# Patient Record
Sex: Male | Born: 1950
Health system: Southern US, Community
[De-identification: ages and names within clinical notes are randomized; demographics above are authoritative.]

## PROBLEM LIST (undated history)

## (undated) DIAGNOSIS — I447 Left bundle-branch block, unspecified: Secondary | ICD-10-CM

## (undated) DIAGNOSIS — C61 Malignant neoplasm of prostate: Secondary | ICD-10-CM

## (undated) DIAGNOSIS — R338 Other retention of urine: Secondary | ICD-10-CM

## (undated) DIAGNOSIS — N401 Enlarged prostate with lower urinary tract symptoms: Secondary | ICD-10-CM

## (undated) DIAGNOSIS — Z789 Other specified health status: Secondary | ICD-10-CM

## (undated) DIAGNOSIS — Z923 Personal history of irradiation: Secondary | ICD-10-CM

## (undated) DIAGNOSIS — E7800 Pure hypercholesterolemia, unspecified: Secondary | ICD-10-CM

## (undated) DIAGNOSIS — I7090 Unspecified atherosclerosis: Secondary | ICD-10-CM

## (undated) DIAGNOSIS — I10 Essential (primary) hypertension: Secondary | ICD-10-CM

## (undated) HISTORY — DX: Unspecified atherosclerosis: I70.90

## (undated) HISTORY — DX: Left bundle-branch block, unspecified: I44.7

## (undated) HISTORY — DX: Essential (primary) hypertension: I10

## (undated) HISTORY — DX: Pure hypercholesterolemia, unspecified: E78.00

## (undated) HISTORY — DX: Personal history of irradiation: Z92.3

---

## 2002-11-19 ENCOUNTER — Emergency Department (HOSPITAL_COMMUNITY): Admission: EM | Admit: 2002-11-19 | Discharge: 2002-11-19 | Payer: Self-pay | Admitting: *Deleted

## 2002-11-27 ENCOUNTER — Emergency Department (HOSPITAL_COMMUNITY): Admission: EM | Admit: 2002-11-27 | Discharge: 2002-11-27 | Payer: Self-pay | Admitting: Emergency Medicine

## 2011-10-10 HISTORY — PX: PROSTATE BIOPSY: SHX241

## 2011-10-18 ENCOUNTER — Other Ambulatory Visit: Payer: Self-pay | Admitting: Urology

## 2011-10-18 DIAGNOSIS — C61 Malignant neoplasm of prostate: Secondary | ICD-10-CM

## 2011-10-31 ENCOUNTER — Encounter (HOSPITAL_COMMUNITY): Payer: Self-pay

## 2011-10-31 ENCOUNTER — Encounter (HOSPITAL_COMMUNITY)
Admission: RE | Admit: 2011-10-31 | Discharge: 2011-10-31 | Disposition: A | Discharge status: Home / Self Care | Payer: Managed Care, Other (non HMO) | Source: Ambulatory Visit | Attending: Urology | Admitting: Urology

## 2011-10-31 DIAGNOSIS — C61 Malignant neoplasm of prostate: Secondary | ICD-10-CM | POA: Insufficient documentation

## 2011-10-31 HISTORY — DX: Malignant neoplasm of prostate: C61

## 2011-10-31 MED ORDER — TECHNETIUM TC 99M MEDRONATE IV KIT
23.9000 | PACK | Freq: Once | INTRAVENOUS | Status: AC | PRN
  Administered 2011-10-31: 23.9 via INTRAVENOUS

## 2011-11-15 ENCOUNTER — Encounter: Payer: Self-pay | Admitting: *Deleted

## 2011-11-15 ENCOUNTER — Encounter: Payer: Self-pay | Admitting: Radiation Oncology

## 2011-11-18 ENCOUNTER — Ambulatory Visit: Payer: Managed Care, Other (non HMO)

## 2011-11-18 ENCOUNTER — Ambulatory Visit
Admission: RE | Admit: 2011-11-18 | Discharge: 2011-11-18 | Disposition: A | Discharge status: Home / Self Care | Payer: Managed Care, Other (non HMO) | Source: Ambulatory Visit | Attending: Radiation Oncology | Admitting: Radiation Oncology

## 2011-11-18 ENCOUNTER — Ambulatory Visit: Payer: Managed Care, Other (non HMO) | Admitting: Radiation Oncology

## 2011-11-18 ENCOUNTER — Encounter: Payer: Self-pay | Admitting: Radiation Oncology

## 2011-11-18 VITALS — BP 156/79 | HR 72 | Temp 97.6°F | Wt 157.8 lb

## 2011-11-18 DIAGNOSIS — R351 Nocturia: Secondary | ICD-10-CM | POA: Insufficient documentation

## 2011-11-18 DIAGNOSIS — Z8042 Family history of malignant neoplasm of prostate: Secondary | ICD-10-CM | POA: Insufficient documentation

## 2011-11-18 DIAGNOSIS — E7800 Pure hypercholesterolemia, unspecified: Secondary | ICD-10-CM | POA: Insufficient documentation

## 2011-11-18 DIAGNOSIS — R300 Dysuria: Secondary | ICD-10-CM | POA: Insufficient documentation

## 2011-11-18 DIAGNOSIS — C61 Malignant neoplasm of prostate: Secondary | ICD-10-CM

## 2011-11-18 DIAGNOSIS — F17200 Nicotine dependence, unspecified, uncomplicated: Secondary | ICD-10-CM | POA: Insufficient documentation

## 2011-11-18 DIAGNOSIS — I10 Essential (primary) hypertension: Secondary | ICD-10-CM | POA: Insufficient documentation

## 2011-11-18 DIAGNOSIS — Z510 Encounter for antineoplastic radiation therapy: Secondary | ICD-10-CM | POA: Insufficient documentation

## 2011-11-18 NOTE — Progress Notes (Signed)
Radiation Oncology         [phone]) [phone] ________________________________  Initial outpatient Consultation  Name: Thomas Grant MRN: [id number]  Date: 11/18/2011  DOB: Jan 30, 1951  CC:  Thomas Matar, MD   REFERRING PHYSICIAN: Marcine Matar, MD  DIAGNOSIS: 61 year old gentleman with stage TI C. adenocarcinoma prostate with Gleason score of 4+3 and a PSA of 8.29  HISTORY OF PRESENT ILLNESS::Thomas Grant is a 61 y.o. male.  He has a family history of prostate cancer in his brother and was noted to have an elevated PSA of 8.29 by his primary care physician.  Accordingly, he was referred for evaluation in urology by Dr. Retta Grant and digital rectal examination was performed at that time revealing no palpable nodules.  The patient proceeded to transrectal ultrasound with 12 biopsies of the prostate on 10/10/2011.  The prostate volume measured 75 cc.  Out of 12 core biopsies, 11 were positive.  The maximum Gleason score was 4+3, and this was seen in  10% left lateral base. Gleason's 3+3 was noted in 30% of the right base, 50% of the right lateral mid, 50% of the right mid, 50% of the right lateral apex, 20% of the right apex, 5% left base, 10% left lateral base, 30% left mid, 20% left lateral apex, and 10% left apex..  The patient reviewed the biopsy results with his urologist and he has kindly been referred today for discussion of potential radiation treatment options.   PREVIOUS RADIATION THERAPY: No  PAST MEDICAL HISTORY:  has a past medical history of Prostate cancer (10/10/11 bx); HTN (hypertension); Hypercholesterolemia; and Atherosclerosis.    PAST SURGICAL HISTORY: Past Surgical History  Procedure Date  . Prostate biopsy 10/10/11    Adenocarcinoma,gleason= 3+3=6,& 4+3=7,volume=75.8cc,Psa=8.29    FAMILY HISTORY: family history is not on file.  SOCIAL HISTORY:  reports that he has been smoking.  He has never used smokeless tobacco. He reports that he drinks about 1.8  ounces of alcohol per week.  ALLERGIES: Review of patient's allergies indicates no known allergies.  MEDICATIONS:  Current Outpatient Prescriptions  Medication Sig Dispense Refill  . Cyanocobalamin (VITAMIN B 12 PO) Take 1,000 mcg by mouth daily.      . fish oil-omega-3 fatty acids 1000 MG capsule Take 1 g by mouth daily.      . Multiple Vitamin (MULTIVITAMIN) tablet Take 1 tablet by mouth daily.      . silodosin (RAPAFLO) 8 MG CAPS capsule Take 8 mg by mouth daily with breakfast.      . Tamsulosin HCl (FLOMAX) 0.4 MG CAPS Take 0.4 mg by mouth Daily.      . vitamin C (ASCORBIC ACID) 500 MG tablet Take 500 mg by mouth daily.      . Zinc 100 MG TABS Take 100 mg by mouth daily.        REVIEW OF SYSTEMS:  A 15 point review of systems is documented in the electronic medical record. This was obtained by the nursing staff. However, I reviewed this with the patient to discuss relevant findings and make appropriate changes.  A comprehensive review of systems was negative. the patient completed a urinary symptom questionnaire.  He also filled out erectile function questionnaire indicating that he is able to achieve an erection capable of completion of sexual activity on most attempts.   PHYSICAL EXAM: NAD, A&O,  Filed Vitals:   11/18/11 1548  BP: 156/79  Pulse: 72  Temp: 97.6 F (36.4 C)  No nodules on Thomas Grant DRE  RADIOGRAPHY: Nm Bone Scan Whole Body  10/31/2011  *RADIOLOGY REPORT*  Clinical Data: New diagnosis of prostate cancer.  PSA is 8.29.  The patient reports no bone pain, trauma, or fractures.  NUCLEAR MEDICINE WHOLE BODY BONE SCINTIGRAPHY  Technique:  Whole body anterior and posterior images were obtained approximately 3 hours after intravenous injection of radiopharmaceutical.  Radiopharmaceutical: 23. CURIE TC-MDP TECHNETIUM TC 47M MEDRONATE IV KIT  Comparison: CT abdomen pelvis 10/31/2011  Findings: The patient's urinary bladder is quite distended, as seen on today's CT  abdomen and pelvis, suggesting there may be chronic bladder outlet obstruction.  There is no uptake of radiotracer to suggest bony metastatic disease.  Symmetric uptake in the region of the first carpometacarpal joints bilaterally is consistent with degenerative change.  IMPRESSION:  1.  No evidence of bony metastatic disease. 2.  Distended urinary bladder.  Original Report Authenticated By: Thomas Grant, M.D.     IMPRESSION: Thomas Grant is a very nice 61 year-old gentleman with stage TI C. adenocarcinoma prostate with Gleason score 4+3 and a PSA of 8.29. He falls into an intermediate risk group in terms of his Gleason score and appears to have a high volume disease in terms of percentage cores positive. The patient is eligible for a variety of potential treatment options including robotic-assisted laparoscopic radical prostatectomy, and radiation therapy with or without seed implant as boost. With radiotherapy, the patient would also potentially benefit from neoadjuvant androgen deprivation.  PLAN:Today I reviewed the findings and workup thus far.  We discussed the natural history of prostate cancer.  We reviewed the the implications of T-stage, Gleason's Score, and PSA on decision-making and outcomes in prostate cancer.  We discussed radiation treatment in the management of prostate cancer with regard to the logistics and delivery of external beam radiation treatment as well as the logistics and delivery of prostate brachytherapy.  We compared and contrasted each of these approaches and also compared these against prostatectomy.  The patient expressed interest in hormonal therapy and external radiation followed by seed boost.  I filled out a patient counseling form for him with relevant treatment diagrams and we retained a copy for our records.   The patient would like to proceed with androgen deprivation and radiotherapy using brachytherapy boost.  I will share my findings with Dr. Retta Grant and move  forward discussion of androgen deprivation now with scheduling placement of three gold fiducial markers 6 weeks later into the prostate to proceed with radiotherapy.     I enjoyed meeting with him today, and will look forward to participating in the care of this very nice gentleman.   I spent 60 minutes minutes face to face with the patient and more than 50% of that time was spent in counseling and/or coordination of care.   ------------------------------------------------  Artist Pais. Kathrynn Running, M.D.

## 2011-11-18 NOTE — Progress Notes (Signed)
Patient here for formal prostate consultation.Patient relatively healthy. No surgical history.Leaning towards seed implant but just gathering information. IPSS 14, occasional weak stream and nocturia.States that he has had an enlarged prostate as teenager. Gleason (1)  4+3=7 PSA 8.29 (10) 3+3=6.  Prostate volume 76 cc's

## 2011-11-20 ENCOUNTER — Telehealth: Payer: Self-pay | Admitting: *Deleted

## 2011-11-20 NOTE — Telephone Encounter (Signed)
CALLED PATIENT TO INFORM OF APPT. CHANGE  FOR HORMONE THERAPY W/DR. DAHLSTEDT TO 11-27-11- ARRIVAL TIME - 1:45 PM, SPOKE WITH PATIENT AND HE IS AWARE OF THE APPT. CHANGE.

## 2011-11-20 NOTE — Telephone Encounter (Signed)
CALLED PATIENT TO INFORM OF APPT. WITH DR. DAHLSTEDT ON 11-25-11 - ARRIVAL TIME 2:45 PM AND HIS GOLD SEED PLACEMENT ON 01-01-12- ARRIVAL TIME - 10:30 AM, SPOKE WITH PATIENT AND HE IS AWARE OF THESE APPTS.

## 2011-11-22 NOTE — Addendum Note (Signed)
Encounter addended by: Delynn Flavin, RN on: 11/22/2011  7:11 PM<BR>     Documentation filed: Charges VN

## 2011-11-28 ENCOUNTER — Telehealth: Payer: Self-pay | Admitting: *Deleted

## 2011-11-28 NOTE — Telephone Encounter (Signed)
Called patient to inform of appt. For sim on October 4 at 10:00 am, spoke with patient and he is aware of this appt.

## 2011-12-12 ENCOUNTER — Ambulatory Visit: Payer: Managed Care, Other (non HMO) | Admitting: Radiation Oncology

## 2011-12-12 ENCOUNTER — Ambulatory Visit: Payer: Managed Care, Other (non HMO)

## 2011-12-17 ENCOUNTER — Encounter: Payer: Self-pay | Admitting: Gastroenterology

## 2012-01-03 ENCOUNTER — Ambulatory Visit: Payer: Managed Care, Other (non HMO) | Admitting: Radiation Oncology

## 2012-01-03 ENCOUNTER — Ambulatory Visit: Payer: Managed Care, Other (non HMO)

## 2012-01-07 ENCOUNTER — Telehealth: Payer: Self-pay | Admitting: *Deleted

## 2012-01-07 NOTE — Telephone Encounter (Signed)
CALLED PATIENT TO ALTER APPT. TO  ARRIVAL TIME OF 7:50 AM ON 01-08-12, LVM FOR A RETURN CALL

## 2012-01-07 NOTE — Telephone Encounter (Signed)
CALLED PATIENT TO ALTER APPT. FOR 01-08-12, - ARRIVAL TIME - 7:50 AM , LVM FOR A RETURN CALL

## 2012-01-08 ENCOUNTER — Ambulatory Visit
Admission: RE | Admit: 2012-01-08 | Discharge: 2012-01-08 | Disposition: A | Discharge status: Home / Self Care | Payer: Managed Care, Other (non HMO) | Source: Ambulatory Visit | Attending: Radiation Oncology | Admitting: Radiation Oncology

## 2012-01-08 ENCOUNTER — Encounter: Payer: Self-pay | Admitting: Radiation Oncology

## 2012-01-08 DIAGNOSIS — C61 Malignant neoplasm of prostate: Secondary | ICD-10-CM

## 2012-01-08 NOTE — Progress Notes (Signed)
Met with patient to discuss RO billing. ° °Dx: 185 Malignant neoplasm of prostate  ° ° °Attending Rad: Dr. Manning ° ° °Rad Tx: [phone]  IMRT °

## 2012-01-08 NOTE — Progress Notes (Signed)
  Radiation Oncology         [phone]) [phone] ________________________________  Name: Thomas Grant MRN: [id number]  Date: 01/08/2012  DOB: 11-22-1950  SIMULATION AND TREATMENT PLANNING NOTE  DIAGNOSIS:  61 year old gentleman with stage T1c adenocarcinoma prostate with Gleason score of 4+3 and a PSA of 8.29  NARRATIVE:  The patient was brought to the CT Simulation planning suite.  Identity was confirmed.  All relevant records and images related to the planned course of therapy were reviewed.  The patient freely provided informed written consent to proceed with treatment after reviewing the details related to the planned course of therapy. The consent form was witnessed and verified by the simulation staff.  Then, the patient was set-up in a stable reproducible  supine position for radiation therapy.  CT images were obtained.  Surface markings were placed.  The CT images were loaded into the planning software.  Then the target and avoidance structures were contoured.  Treatment planning then occurred.  The radiation prescription was entered and confirmed.  A total of one complex treatment device was fabricated. I have requested : Intensity Modulated Radiotherapy (IMRT) is medically necessary for this case for the following reason:  Rectal sparing.Marland Kitchen    PLAN:  The patient will receive 78 Gy in 40 fractions.  ________________________________  Artist Pais Kathrynn Running, M.D.

## 2012-01-13 ENCOUNTER — Encounter: Payer: Self-pay | Admitting: *Deleted

## 2012-01-14 ENCOUNTER — Encounter: Payer: Self-pay | Admitting: Radiation Oncology

## 2012-01-14 NOTE — Progress Notes (Signed)
  Radiation Oncology         [phone]) [phone] ________________________________  Name: Thomas Grant MRN: [id number]  Date: 01/14/2012  DOB: December 23, 1950  SIMULATION AND TREATMENT PLANNING NOTE PUBIC ARCH STUDY  DIAGNOSIS: 61 year old gentleman with stage T1c adenocarcinoma prostate with Gleason score of 4+3 and a PSA of 8.29  COMPLEX SIMULATION:  The patient presented today for evaluation for possible prostate seed implant. He was brought to the radiation planning suite and placed supine on the CT couch. A 3-dimensional image study set was obtained in upload to the planning computer. There, on each axial slice, I contoured the prostate gland. Then, using three-dimensional radiation planning tools I reconstructed the prostate in view of the structures from the transperineal needle pathway to assess for possible pubic arch interference. In doing so, I did not appreciate any pubic arch interference. Also, the patient's prostate volume was estimated based on the drawn structure. The volume was 67 cc.  He does have a large prostate, but, is actively receiving hormone therapy which should help reduce the volume for seed implant.  He also has a fair amount of pubic arch interference, but, with hyperflexion of the hips, he would still be a candidate to proceed with prostate seed implant. Today, he freely provided informed written consent to proceed.    PLAN: The patient will undergo prostate seed implant as a boost following 45 Gy external beam.   ________________________________  Artist Pais. Kathrynn Running, M.D.

## 2012-01-20 ENCOUNTER — Ambulatory Visit: Payer: Managed Care, Other (non HMO)

## 2012-01-20 ENCOUNTER — Encounter: Payer: Self-pay | Admitting: Radiation Oncology

## 2012-01-20 ENCOUNTER — Ambulatory Visit
Admission: RE | Admit: 2012-01-20 | Discharge: 2012-01-20 | Disposition: A | Discharge status: Home / Self Care | Payer: Managed Care, Other (non HMO) | Source: Ambulatory Visit | Attending: Radiation Oncology | Admitting: Radiation Oncology

## 2012-01-20 NOTE — Progress Notes (Signed)
  Radiation Oncology         [phone]) [phone] ________________________________  Name: Thomas Grant MRN: [id number]  Date: 01/20/2012  DOB: 11-19-1950  Simulation Verification Note  Status: outpatient  NARRATIVE: The patient was brought to the treatment unit and placed in the planned treatment position. The clinical setup was verified. Then port films were obtained and uploaded to the radiation oncology medical record software.  The treatment beams were carefully compared against the planned radiation fields. The position location and shape of the radiation fields was reviewed. They targeted volume of tissue appears to be appropriately covered by the radiation beams. Organs at risk appear to be excluded as planned.  Based on my personal review, I approved the simulation verification. The patient's treatment will proceed as planned.  ------------------------------------------------  Artist Pais Kathrynn Running, M.D.

## 2012-01-21 ENCOUNTER — Ambulatory Visit
Admission: RE | Admit: 2012-01-21 | Discharge: 2012-01-21 | Disposition: A | Discharge status: Home / Self Care | Payer: Managed Care, Other (non HMO) | Source: Ambulatory Visit | Attending: Radiation Oncology | Admitting: Radiation Oncology

## 2012-01-21 ENCOUNTER — Ambulatory Visit: Payer: Managed Care, Other (non HMO)

## 2012-01-22 ENCOUNTER — Ambulatory Visit
Admission: RE | Admit: 2012-01-22 | Discharge: 2012-01-22 | Disposition: A | Discharge status: Home / Self Care | Payer: Managed Care, Other (non HMO) | Source: Ambulatory Visit | Attending: Radiation Oncology | Admitting: Radiation Oncology

## 2012-01-22 ENCOUNTER — Ambulatory Visit: Payer: Managed Care, Other (non HMO)

## 2012-01-22 DIAGNOSIS — C61 Malignant neoplasm of prostate: Secondary | ICD-10-CM

## 2012-01-22 NOTE — Progress Notes (Signed)
Post sim completed w/pt. Gave pt "Radiation and You" booklet w/all pertinent information marked and discussed, re: fatigue, urinary frequency, urinary burning, urinary urgency, diarrhea, rectal discomfort, pain, nutrition. Gave pt RN's business card. Pt verbalized understanding, had no questions.

## 2012-01-23 ENCOUNTER — Encounter: Payer: Self-pay | Admitting: Radiation Oncology

## 2012-01-23 ENCOUNTER — Ambulatory Visit
Admission: RE | Admit: 2012-01-23 | Discharge: 2012-01-23 | Disposition: A | Discharge status: Home / Self Care | Payer: Managed Care, Other (non HMO) | Source: Ambulatory Visit | Attending: Radiation Oncology | Admitting: Radiation Oncology

## 2012-01-23 ENCOUNTER — Ambulatory Visit: Payer: Managed Care, Other (non HMO)

## 2012-01-23 VITALS — BP 128/69 | HR 70 | Temp 97.8°F | Resp 20 | Ht 73.0 in | Wt 157.5 lb

## 2012-01-23 DIAGNOSIS — C61 Malignant neoplasm of prostate: Secondary | ICD-10-CM

## 2012-01-23 NOTE — Progress Notes (Signed)
  Radiation Oncology         [phone] ________________________________  Name: Hendrick Pavich MRN: [id number]  Date: 01/23/2012  DOB: 1950/06/04  Weekly Radiation Therapy Management  Current Dose: 5.4 Gy     Planned Dose:  45 Gy plus seed implant  Narrative . . . . . . . . The patient presents for routine under treatment assessment.                                  The patient is without complaint.                                 Set-up films were reviewed.                                 The chart was checked. Physical Findings. . .  weight is 157 lb 8 oz (71.442 kg). His oral temperature is 97.8 F (36.6 C). His blood pressure is 128/69 and his pulse is 70. His respiration is 20. . Weight essentially stable.  No significant changes. Impression . . . . . . . The patient is  tolerating radiation. Plan . . . . . . . . . . . . Continue treatment as planned.  ________________________________  Artist Pais. Kathrynn Running, M.D.

## 2012-01-23 NOTE — Addendum Note (Signed)
Encounter addended by: Glennie Hawk, RN on: 01/23/2012  9:07 AM<BR>     Documentation filed: Vitals Section

## 2012-01-23 NOTE — Patient Instructions (Signed)
Call if you have any questions.

## 2012-01-23 NOTE — Progress Notes (Signed)
Pt denies pain, fatigue, loss of appetite; no c/o.

## 2012-01-24 ENCOUNTER — Ambulatory Visit
Admission: RE | Admit: 2012-01-24 | Discharge: 2012-01-24 | Disposition: A | Discharge status: Home / Self Care | Payer: Managed Care, Other (non HMO) | Source: Ambulatory Visit | Attending: Radiation Oncology | Admitting: Radiation Oncology

## 2012-01-24 ENCOUNTER — Ambulatory Visit: Payer: Managed Care, Other (non HMO)

## 2012-01-27 ENCOUNTER — Ambulatory Visit: Payer: Managed Care, Other (non HMO)

## 2012-01-27 ENCOUNTER — Ambulatory Visit
Admission: RE | Admit: 2012-01-27 | Discharge: 2012-01-27 | Disposition: A | Discharge status: Home / Self Care | Payer: Managed Care, Other (non HMO) | Source: Ambulatory Visit | Attending: Radiation Oncology | Admitting: Radiation Oncology

## 2012-01-28 ENCOUNTER — Ambulatory Visit: Payer: Managed Care, Other (non HMO)

## 2012-01-28 ENCOUNTER — Ambulatory Visit
Admission: RE | Admit: 2012-01-28 | Discharge: 2012-01-28 | Disposition: A | Discharge status: Home / Self Care | Payer: Managed Care, Other (non HMO) | Source: Ambulatory Visit | Attending: Radiation Oncology | Admitting: Radiation Oncology

## 2012-01-29 ENCOUNTER — Ambulatory Visit: Payer: Managed Care, Other (non HMO)

## 2012-01-29 ENCOUNTER — Ambulatory Visit
Admission: RE | Admit: 2012-01-29 | Discharge: 2012-01-29 | Disposition: A | Discharge status: Home / Self Care | Payer: Managed Care, Other (non HMO) | Source: Ambulatory Visit | Attending: Radiation Oncology | Admitting: Radiation Oncology

## 2012-01-30 ENCOUNTER — Ambulatory Visit: Payer: Managed Care, Other (non HMO)

## 2012-01-30 ENCOUNTER — Ambulatory Visit
Admission: RE | Admit: 2012-01-30 | Discharge: 2012-01-30 | Disposition: A | Discharge status: Home / Self Care | Payer: Managed Care, Other (non HMO) | Source: Ambulatory Visit | Attending: Radiation Oncology | Admitting: Radiation Oncology

## 2012-01-31 ENCOUNTER — Ambulatory Visit
Admission: RE | Admit: 2012-01-31 | Discharge: 2012-01-31 | Disposition: A | Discharge status: Home / Self Care | Payer: Managed Care, Other (non HMO) | Source: Ambulatory Visit | Attending: Radiation Oncology | Admitting: Radiation Oncology

## 2012-01-31 ENCOUNTER — Encounter: Payer: Self-pay | Admitting: Radiation Oncology

## 2012-01-31 ENCOUNTER — Ambulatory Visit: Payer: Managed Care, Other (non HMO)

## 2012-01-31 ENCOUNTER — Telehealth: Payer: Self-pay | Admitting: *Deleted

## 2012-01-31 VITALS — BP 131/81 | HR 69 | Temp 98.3°F | Resp 20 | Ht 71.0 in | Wt 157.9 lb

## 2012-01-31 DIAGNOSIS — R300 Dysuria: Secondary | ICD-10-CM

## 2012-01-31 LAB — URINALYSIS, MICROSCOPIC - CHCC
Bilirubin (Urine): NEGATIVE
Specific Gravity, Urine: 1.01 (ref 1.003–1.035)
pH: 6 (ref 4.6–8.0)

## 2012-01-31 NOTE — Progress Notes (Signed)
  Radiation Oncology         [phone] ________________________________  Name: Thomas Grant MRN: [id number]  Date: 01/31/2012  DOB: 1950-07-28  Weekly Radiation Therapy Management  Current Dose: 16.2 Gy     Planned Dose:  45 Gy  Narrative . . . . . . . . The patient presents for routine under treatment assessment.                                                     The patient is without complaint.                                 Set-up films were reviewed.                                 The chart was checked. Physical Findings. . .  height is 5\' 11"  (1.803 m) and weight is 157 lb 14.4 oz (71.623 kg). His oral temperature is 98.3 F (36.8 C). His blood pressure is 131/81 and his pulse is 69. His respiration is 20. . Weight essentially stable.  No significant changes. Impression . . . . . . . The patient is  tolerating radiation. Plan . . . . . . . . . . . . Continue treatment as planned.  ________________________________  Artist Pais. Kathrynn Running, M.D.

## 2012-01-31 NOTE — Telephone Encounter (Signed)
Per Dr Kathrynn Running, left vm informing pt his 01/31/12 UA results are normal. Left call back # if pt had any questions.

## 2012-01-31 NOTE — Progress Notes (Signed)
Pt states this week he has developed weak urinary stream w/difficulty initiating stream and emptying bladder, dysuria, freq q 1 hr, nocturia x 4-5. He states his bm's are softer w/increase in flatus. Pt denies other pain, fatigue, loss of appetite.

## 2012-01-31 NOTE — Progress Notes (Signed)
Quick Note:  Please call patient with normal result.  Thanks. MM ______ 

## 2012-02-02 NOTE — Progress Notes (Signed)
Quick Note:  Please call patient with normal result.  Thanks. MM ______ 

## 2012-02-03 ENCOUNTER — Ambulatory Visit
Admission: RE | Admit: 2012-02-03 | Discharge: 2012-02-03 | Disposition: A | Discharge status: Home / Self Care | Payer: Managed Care, Other (non HMO) | Source: Ambulatory Visit | Attending: Radiation Oncology | Admitting: Radiation Oncology

## 2012-02-03 ENCOUNTER — Ambulatory Visit: Payer: Managed Care, Other (non HMO)

## 2012-02-03 NOTE — Progress Notes (Signed)
Per Dr Kathrynn Running, informed pt his UA, culture results from 01/31/12 were normal.

## 2012-02-04 ENCOUNTER — Ambulatory Visit
Admission: RE | Admit: 2012-02-04 | Discharge: 2012-02-04 | Disposition: A | Discharge status: Home / Self Care | Payer: Managed Care, Other (non HMO) | Source: Ambulatory Visit | Attending: Radiation Oncology | Admitting: Radiation Oncology

## 2012-02-04 ENCOUNTER — Ambulatory Visit: Payer: Managed Care, Other (non HMO)

## 2012-02-05 ENCOUNTER — Ambulatory Visit: Payer: Managed Care, Other (non HMO)

## 2012-02-05 ENCOUNTER — Ambulatory Visit
Admission: RE | Admit: 2012-02-05 | Discharge: 2012-02-05 | Disposition: A | Discharge status: Home / Self Care | Payer: Managed Care, Other (non HMO) | Source: Ambulatory Visit | Attending: Radiation Oncology | Admitting: Radiation Oncology

## 2012-02-06 ENCOUNTER — Ambulatory Visit
Admission: RE | Admit: 2012-02-06 | Discharge: 2012-02-06 | Disposition: A | Discharge status: Home / Self Care | Payer: Managed Care, Other (non HMO) | Source: Ambulatory Visit | Attending: Radiation Oncology | Admitting: Radiation Oncology

## 2012-02-06 ENCOUNTER — Encounter: Payer: Self-pay | Admitting: Radiation Oncology

## 2012-02-06 ENCOUNTER — Ambulatory Visit: Payer: Managed Care, Other (non HMO)

## 2012-02-06 VITALS — BP 118/79 | HR 71 | Temp 98.4°F | Resp 20 | Wt 155.0 lb

## 2012-02-06 DIAGNOSIS — C61 Malignant neoplasm of prostate: Secondary | ICD-10-CM

## 2012-02-06 NOTE — Progress Notes (Signed)
  Radiation Oncology         [phone] ________________________________  Name: Thomas Grant MRN: [id number]  Date: 02/06/2012  DOB: 1950-11-23  Weekly Radiation Therapy Management  Current Dose: 23.4 Gy     Planned Dose:  45 Gy plus seeds.  Narrative . . . . . . . . The patient presents for routine under treatment assessment.                                                  Pt states he continues to have dysuria, nocturia x 2-3, increased flatus. Pt doubled up his Rapaflo, states he has noticed improvement. Pt continues to have urinary pressure and difficulty initiating stream.                                 Set-up films were reviewed.                                 The chart was checked. Physical Findings. . .  weight is 155 lb (70.308 kg). His oral temperature is 98.4 F (36.9 C). His blood pressure is 118/79 and his pulse is 71. His respiration is 20. . Weight essentially stable.  No significant changes. Impression . . . . . . . The patient is  tolerating radiation. Plan . . . . . . . . . . . . Continue treatment as planned.  ________________________________  Artist Pais. Kathrynn Running, M.D.

## 2012-02-06 NOTE — Progress Notes (Signed)
Pt states he continues to have dysuria, nocturia x 2-3, increased flatus. Pt doubled up his Rapaflo, states he has noticed improvement. Pt continues to have urinary pressure and difficulty initiating stream.

## 2012-02-07 ENCOUNTER — Ambulatory Visit
Admission: RE | Admit: 2012-02-07 | Discharge: 2012-02-07 | Disposition: A | Discharge status: Home / Self Care | Payer: Managed Care, Other (non HMO) | Source: Ambulatory Visit | Attending: Radiation Oncology | Admitting: Radiation Oncology

## 2012-02-07 ENCOUNTER — Ambulatory Visit: Payer: Managed Care, Other (non HMO)

## 2012-02-10 ENCOUNTER — Ambulatory Visit: Payer: Managed Care, Other (non HMO)

## 2012-02-10 ENCOUNTER — Ambulatory Visit
Admission: RE | Admit: 2012-02-10 | Discharge: 2012-02-10 | Disposition: A | Discharge status: Home / Self Care | Payer: Managed Care, Other (non HMO) | Source: Ambulatory Visit | Attending: Radiation Oncology | Admitting: Radiation Oncology

## 2012-02-11 ENCOUNTER — Other Ambulatory Visit: Payer: Self-pay | Admitting: Urology

## 2012-02-11 ENCOUNTER — Ambulatory Visit
Admission: RE | Admit: 2012-02-11 | Discharge: 2012-02-11 | Disposition: A | Discharge status: Home / Self Care | Payer: Managed Care, Other (non HMO) | Source: Ambulatory Visit | Attending: Radiation Oncology | Admitting: Radiation Oncology

## 2012-02-11 ENCOUNTER — Ambulatory Visit: Payer: Managed Care, Other (non HMO)

## 2012-02-12 ENCOUNTER — Ambulatory Visit: Payer: Managed Care, Other (non HMO)

## 2012-02-12 ENCOUNTER — Ambulatory Visit
Admission: RE | Admit: 2012-02-12 | Discharge: 2012-02-12 | Disposition: A | Discharge status: Home / Self Care | Payer: Managed Care, Other (non HMO) | Source: Ambulatory Visit | Attending: Radiation Oncology | Admitting: Radiation Oncology

## 2012-02-13 ENCOUNTER — Encounter: Payer: Self-pay | Admitting: Radiation Oncology

## 2012-02-13 ENCOUNTER — Ambulatory Visit: Payer: Managed Care, Other (non HMO)

## 2012-02-13 ENCOUNTER — Ambulatory Visit
Admission: RE | Admit: 2012-02-13 | Discharge: 2012-02-13 | Disposition: A | Discharge status: Home / Self Care | Payer: Managed Care, Other (non HMO) | Source: Ambulatory Visit | Attending: Radiation Oncology | Admitting: Radiation Oncology

## 2012-02-13 VITALS — BP 144/75 | HR 75 | Temp 98.0°F | Resp 20 | Wt 158.3 lb

## 2012-02-13 DIAGNOSIS — C61 Malignant neoplasm of prostate: Secondary | ICD-10-CM

## 2012-02-13 NOTE — Progress Notes (Signed)
  Radiation Oncology         [phone] ________________________________  Name: Thomas Grant MRN: [id number]  Date: 02/13/2012  DOB: 05-22-1950  Weekly Radiation Therapy Management  Current Dose: 32.4 Gy     Planned Dose:  45 Gy plus seeds  Narrative . . . . . . . . The patient presents for routine under treatment assessment.                                           Patient here for weekly rad txs, 18/26 completed, still has dysuria, hard to start his stream, weak, and doesn't empty completely, urgency and frequency as well when voiding, nocturia 3-4x night, eating and drinking well, no other issues    Set-up films were reviewed.                                 The chart was checked. Physical Findings. . .  weight is 158 lb 4.8 oz (71.804 kg). His oral temperature is 98 F (36.7 C). His blood pressure is 144/75 and his pulse is 75. His respiration is 20. . Weight essentially stable.  No significant changes. Impression . . . . . . . The patient is  tolerating radiation. Plan . . . . . . . . . . . . Continue treatment as planned.  ________________________________  Artist Pais. Kathrynn Running, M.D.

## 2012-02-13 NOTE — Progress Notes (Signed)
Patient here for weekly rad txs, 18/26 completed, still has dysuria, hard to start his stream, weak, and doesn't empty completely, urgency and frequency as well when voiding, nocturia 3-4x night, eating and drinking well, no other issues 8:31 AM

## 2012-02-14 ENCOUNTER — Ambulatory Visit
Admission: RE | Admit: 2012-02-14 | Discharge: 2012-02-14 | Disposition: A | Discharge status: Home / Self Care | Payer: Managed Care, Other (non HMO) | Source: Ambulatory Visit | Attending: Radiation Oncology | Admitting: Radiation Oncology

## 2012-02-14 ENCOUNTER — Ambulatory Visit: Payer: Managed Care, Other (non HMO)

## 2012-02-17 ENCOUNTER — Ambulatory Visit: Payer: Managed Care, Other (non HMO)

## 2012-02-17 ENCOUNTER — Ambulatory Visit
Admission: RE | Admit: 2012-02-17 | Discharge: 2012-02-17 | Disposition: A | Discharge status: Home / Self Care | Payer: Managed Care, Other (non HMO) | Source: Ambulatory Visit | Attending: Radiation Oncology | Admitting: Radiation Oncology

## 2012-02-17 DIAGNOSIS — Z8042 Family history of malignant neoplasm of prostate: Secondary | ICD-10-CM | POA: Insufficient documentation

## 2012-02-17 DIAGNOSIS — R351 Nocturia: Secondary | ICD-10-CM | POA: Insufficient documentation

## 2012-02-17 DIAGNOSIS — I10 Essential (primary) hypertension: Secondary | ICD-10-CM | POA: Insufficient documentation

## 2012-02-17 DIAGNOSIS — F17200 Nicotine dependence, unspecified, uncomplicated: Secondary | ICD-10-CM | POA: Insufficient documentation

## 2012-02-17 DIAGNOSIS — Z510 Encounter for antineoplastic radiation therapy: Secondary | ICD-10-CM | POA: Insufficient documentation

## 2012-02-17 DIAGNOSIS — C61 Malignant neoplasm of prostate: Secondary | ICD-10-CM | POA: Insufficient documentation

## 2012-02-17 DIAGNOSIS — R300 Dysuria: Secondary | ICD-10-CM | POA: Insufficient documentation

## 2012-02-17 DIAGNOSIS — E7800 Pure hypercholesterolemia, unspecified: Secondary | ICD-10-CM | POA: Insufficient documentation

## 2012-02-18 ENCOUNTER — Ambulatory Visit: Payer: Managed Care, Other (non HMO)

## 2012-02-18 ENCOUNTER — Ambulatory Visit
Admission: RE | Admit: 2012-02-18 | Discharge: 2012-02-18 | Disposition: A | Discharge status: Home / Self Care | Payer: Managed Care, Other (non HMO) | Source: Ambulatory Visit | Attending: Radiation Oncology | Admitting: Radiation Oncology

## 2012-02-19 ENCOUNTER — Ambulatory Visit (HOSPITAL_BASED_OUTPATIENT_CLINIC_OR_DEPARTMENT_OTHER)
Admission: RE | Admit: 2012-02-19 | Discharge: 2012-02-19 | Disposition: A | Discharge status: Home / Self Care | Payer: Managed Care, Other (non HMO) | Source: Ambulatory Visit | Attending: Urology | Admitting: Urology

## 2012-02-19 ENCOUNTER — Encounter (HOSPITAL_BASED_OUTPATIENT_CLINIC_OR_DEPARTMENT_OTHER)
Admission: RE | Admit: 2012-02-19 | Discharge: 2012-02-19 | Disposition: A | Discharge status: Home / Self Care | Payer: Managed Care, Other (non HMO) | Source: Ambulatory Visit | Attending: Urology | Admitting: Urology

## 2012-02-19 ENCOUNTER — Ambulatory Visit
Admission: RE | Admit: 2012-02-19 | Discharge: 2012-02-19 | Disposition: A | Discharge status: Home / Self Care | Payer: Managed Care, Other (non HMO) | Source: Ambulatory Visit | Attending: Radiation Oncology | Admitting: Radiation Oncology

## 2012-02-19 ENCOUNTER — Ambulatory Visit: Payer: Managed Care, Other (non HMO)

## 2012-02-19 ENCOUNTER — Other Ambulatory Visit: Payer: Self-pay

## 2012-02-19 DIAGNOSIS — C61 Malignant neoplasm of prostate: Secondary | ICD-10-CM | POA: Insufficient documentation

## 2012-02-20 ENCOUNTER — Ambulatory Visit: Payer: Managed Care, Other (non HMO)

## 2012-02-20 ENCOUNTER — Encounter: Payer: Self-pay | Admitting: Radiation Oncology

## 2012-02-20 ENCOUNTER — Ambulatory Visit
Admission: RE | Admit: 2012-02-20 | Discharge: 2012-02-20 | Disposition: A | Discharge status: Home / Self Care | Payer: Managed Care, Other (non HMO) | Source: Ambulatory Visit | Attending: Radiation Oncology | Admitting: Radiation Oncology

## 2012-02-20 VITALS — BP 114/84 | HR 76 | Temp 98.0°F | Resp 20 | Wt 156.1 lb

## 2012-02-20 DIAGNOSIS — C61 Malignant neoplasm of prostate: Secondary | ICD-10-CM

## 2012-02-20 NOTE — Progress Notes (Signed)
Pt states he began Flomax a few days ago and his urinary, bowel status "is almost normal". He states he has nocturia x 1-2, no dysuria, urgency, or other issues he was experiencing. Pt states less fatigue as well.

## 2012-02-20 NOTE — Progress Notes (Signed)
  Radiation Oncology         [phone] ________________________________  Name: Thomas Grant MRN: [id number]  Date: 02/20/2012  DOB: Jul 28, 1950  Weekly Radiation Therapy Management  Current Dose: 41.4 Gy     Planned Dose:  45 Gy plus seeds  Narrative . . . . . . . . The patient presents for routine under treatment assessment.                                           The patient is without complaint.                                 Set-up films were reviewed.                                 The chart was checked. Physical Findings. . .  weight is 156 lb 1.6 oz (70.806 kg). His oral temperature is 98 F (36.7 C). His blood pressure is 114/84 and his pulse is 76. His respiration is 20. . Weight essentially stable.  No significant changes. Impression . . . . . . . The patient is  tolerating radiation. Plan . . . . . . . . . . . . Continue treatment as planned.  ________________________________  Artist Pais. Kathrynn Running, M.D.

## 2012-02-21 ENCOUNTER — Ambulatory Visit
Admission: RE | Admit: 2012-02-21 | Discharge: 2012-02-21 | Disposition: A | Discharge status: Home / Self Care | Payer: Managed Care, Other (non HMO) | Source: Ambulatory Visit | Attending: Radiation Oncology | Admitting: Radiation Oncology

## 2012-02-24 ENCOUNTER — Ambulatory Visit
Admission: RE | Admit: 2012-02-24 | Discharge: 2012-02-24 | Disposition: A | Discharge status: Home / Self Care | Payer: Managed Care, Other (non HMO) | Source: Ambulatory Visit | Attending: Radiation Oncology | Admitting: Radiation Oncology

## 2012-02-24 ENCOUNTER — Encounter: Payer: Self-pay | Admitting: Radiation Oncology

## 2012-02-25 ENCOUNTER — Ambulatory Visit: Payer: Managed Care, Other (non HMO)

## 2012-02-26 ENCOUNTER — Ambulatory Visit: Payer: Managed Care, Other (non HMO)

## 2012-02-28 ENCOUNTER — Ambulatory Visit: Payer: Managed Care, Other (non HMO)

## 2012-02-29 NOTE — Progress Notes (Signed)
  Radiation Oncology         [phone]) [phone] ________________________________  Name: Thomas Grant MRN: [id number]  Date: 02/24/2012  DOB: 1950-10-31  End of Treatment Note  Diagnosis:   61 year old gentleman with stage TI C. adenocarcinoma prostate with Gleason score of 4+3 and a PSA of 8.29  Indication for treatment:  Curative external radiation with seed implant boost       Radiation treatment dates:  01/21/2012-02/24/2012  Site/dose:   45 Gy was delivered to the prostate in 25 fractions of 1.8 Gy  Beams/energy:   A standard 4-field 3D conformal beam arrangement was used with daily image guidance to deliver 15 MV photons.  Narrative: The patient tolerated radiation treatment relatively well.   He had urinary symptoms and outflow obstructive symptoms.  Plan: The patient has completed radiation treatment. The patient will proceed to seed implant boost in 3 weeks.  ________________________________  Artist Pais Kathrynn Running, M.D.

## 2012-03-02 ENCOUNTER — Ambulatory Visit: Payer: Managed Care, Other (non HMO)

## 2012-03-03 ENCOUNTER — Ambulatory Visit: Payer: Managed Care, Other (non HMO)

## 2012-03-04 ENCOUNTER — Ambulatory Visit: Payer: Managed Care, Other (non HMO)

## 2012-03-05 ENCOUNTER — Ambulatory Visit: Payer: Managed Care, Other (non HMO)

## 2012-03-06 ENCOUNTER — Ambulatory Visit: Payer: Managed Care, Other (non HMO)

## 2012-03-09 ENCOUNTER — Ambulatory Visit: Payer: Managed Care, Other (non HMO)

## 2012-03-10 ENCOUNTER — Ambulatory Visit: Payer: Managed Care, Other (non HMO)

## 2012-03-11 ENCOUNTER — Ambulatory Visit: Payer: Managed Care, Other (non HMO)

## 2012-03-11 ENCOUNTER — Telehealth: Payer: Self-pay | Admitting: *Deleted

## 2012-03-11 NOTE — Telephone Encounter (Signed)
Called patient to remind of appt., lvm for a return call 

## 2012-03-12 ENCOUNTER — Ambulatory Visit: Payer: Managed Care, Other (non HMO)

## 2012-03-12 LAB — CBC
Hemoglobin: 13.9 g/dL (ref 13.0–17.0)
MCV: 91.8 fL (ref 78.0–100.0)
Platelets: 259 10*3/uL (ref 150–400)
RBC: 4.38 MIL/uL (ref 4.22–5.81)
WBC: 5.5 10*3/uL (ref 4.0–10.5)

## 2012-03-12 LAB — COMPREHENSIVE METABOLIC PANEL
ALT: 31 U/L (ref 0–53)
AST: 25 U/L (ref 0–37)
CO2: 28 mEq/L (ref 19–32)
Chloride: 103 mEq/L (ref 96–112)
GFR calc Af Amer: >90 mL/min (ref >90)
GFR calc non Af Amer: >90 mL/min (ref >90)
Glucose, Bld: 94 mg/dL (ref 70–99)
Sodium: 139 mEq/L (ref 135–145)
Total Bilirubin: 0.3 mg/dL (ref 0.3–1.2)

## 2012-03-13 ENCOUNTER — Inpatient Hospital Stay: Admission: RE | Admit: 2012-03-13 | Payer: Self-pay | Source: Ambulatory Visit

## 2012-03-13 ENCOUNTER — Ambulatory Visit: Payer: Managed Care, Other (non HMO)

## 2012-03-16 ENCOUNTER — Ambulatory Visit: Payer: Managed Care, Other (non HMO)

## 2012-03-17 ENCOUNTER — Encounter (HOSPITAL_BASED_OUTPATIENT_CLINIC_OR_DEPARTMENT_OTHER): Payer: Self-pay | Admitting: *Deleted

## 2012-03-17 NOTE — Progress Notes (Signed)
Pt instructed npo p mn 12/18.  To [facility] 12/19 @ 0615.  Pt to do fleets enema prior to arrival. Labs, cxr, ekg in epic.

## 2012-03-18 ENCOUNTER — Telehealth: Payer: Self-pay | Admitting: *Deleted

## 2012-03-18 NOTE — Telephone Encounter (Signed)
Called patient to remind of procedure for 03-19-12, lvm for a return call

## 2012-03-18 NOTE — Anesthesia Preprocedure Evaluation (Addendum)
Anesthesia Evaluation  Patient identified by MRN, date of birth, ID band Patient awake    Reviewed: Allergy & Precautions, H&P , NPO status , Patient's Chart, lab work & pertinent test results  Airway Mallampati: II TM Distance: <3 FB Neck ROM: Full    Dental No notable dental hx.    Pulmonary Current Smoker,  breath sounds clear to auscultation  Pulmonary exam normal       Cardiovascular Rhythm:Regular Rate:Normal     Neuro/Psych negative neurological ROS  negative psych ROS   GI/Hepatic negative GI ROS, Neg liver ROS,   Endo/Other  negative endocrine ROS  Renal/GU negative Renal ROS  negative genitourinary   Musculoskeletal negative musculoskeletal ROS (+)   Abdominal   Peds negative pediatric ROS (+)  Hematology negative hematology ROS (+)   Anesthesia Other Findings   Reproductive/Obstetrics negative OB ROS                          Anesthesia Physical Anesthesia Plan  ASA: II  Anesthesia Plan: General   Post-op Pain Management:    Induction: Intravenous  Airway Management Planned: LMA  Additional Equipment:   Intra-op Plan:   Post-operative Plan:   Informed Consent: I have reviewed the patients History and Physical, chart, labs and discussed the procedure including the risks, benefits and alternatives for the proposed anesthesia with the patient or authorized representative who has indicated his/her understanding and acceptance.   Dental advisory given  Plan Discussed with: CRNA and Surgeon  Anesthesia Plan Comments:        Anesthesia Quick Evaluation

## 2012-03-19 ENCOUNTER — Ambulatory Visit (HOSPITAL_COMMUNITY): Payer: Managed Care, Other (non HMO)

## 2012-03-19 ENCOUNTER — Ambulatory Visit (HOSPITAL_BASED_OUTPATIENT_CLINIC_OR_DEPARTMENT_OTHER): Payer: Managed Care, Other (non HMO) | Admitting: Anesthesiology

## 2012-03-19 ENCOUNTER — Encounter (HOSPITAL_BASED_OUTPATIENT_CLINIC_OR_DEPARTMENT_OTHER): Payer: Self-pay | Admitting: *Deleted

## 2012-03-19 ENCOUNTER — Encounter (HOSPITAL_BASED_OUTPATIENT_CLINIC_OR_DEPARTMENT_OTHER)
Admission: RE | Disposition: A | Discharge status: Home / Self Care | Payer: Self-pay | Source: Ambulatory Visit | Attending: Urology

## 2012-03-19 ENCOUNTER — Encounter (HOSPITAL_BASED_OUTPATIENT_CLINIC_OR_DEPARTMENT_OTHER): Payer: Self-pay | Admitting: Anesthesiology

## 2012-03-19 ENCOUNTER — Ambulatory Visit (HOSPITAL_BASED_OUTPATIENT_CLINIC_OR_DEPARTMENT_OTHER)
Admission: RE | Admit: 2012-03-19 | Discharge: 2012-03-19 | Disposition: A | Discharge status: Home / Self Care | Payer: Managed Care, Other (non HMO) | Source: Ambulatory Visit | Attending: Urology | Admitting: Urology

## 2012-03-19 DIAGNOSIS — E7800 Pure hypercholesterolemia, unspecified: Secondary | ICD-10-CM | POA: Insufficient documentation

## 2012-03-19 DIAGNOSIS — Z79899 Other long term (current) drug therapy: Secondary | ICD-10-CM | POA: Insufficient documentation

## 2012-03-19 DIAGNOSIS — C61 Malignant neoplasm of prostate: Secondary | ICD-10-CM | POA: Insufficient documentation

## 2012-03-19 DIAGNOSIS — I10 Essential (primary) hypertension: Secondary | ICD-10-CM | POA: Insufficient documentation

## 2012-03-19 HISTORY — PX: RADIOACTIVE SEED IMPLANT: SHX5150

## 2012-03-19 HISTORY — PX: CYSTOSCOPY: SHX5120

## 2012-03-19 SURGERY — INSERTION, RADIATION SOURCE, PROSTATE
Anesthesia: General | Site: Prostate | Wound class: Clean Contaminated

## 2012-03-19 MED ORDER — LACTATED RINGERS IV SOLN
INTRAVENOUS | Status: DC
  Administered 2012-03-19 (×3): via INTRAVENOUS
  Filled 2012-03-19: qty 1000

## 2012-03-19 MED ORDER — OXYCODONE HCL 5 MG PO TABS
5.0000 mg | ORAL_TABLET | ORAL | Status: DC | PRN
  Filled 2012-03-19: qty 2

## 2012-03-19 MED ORDER — SODIUM CHLORIDE 0.9 % IJ SOLN
3.0000 mL | INTRAMUSCULAR | Status: DC | PRN
  Filled 2012-03-19: qty 3

## 2012-03-19 MED ORDER — EPHEDRINE SULFATE 50 MG/ML IJ SOLN
INTRAMUSCULAR | Status: DC | PRN
  Administered 2012-03-19 (×2): 10 mg via INTRAVENOUS

## 2012-03-19 MED ORDER — PROMETHAZINE HCL 25 MG/ML IJ SOLN
6.2500 mg | INTRAMUSCULAR | Status: DC | PRN
  Filled 2012-03-19: qty 1

## 2012-03-19 MED ORDER — KETOROLAC TROMETHAMINE 30 MG/ML IJ SOLN
15.0000 mg | Freq: Once | INTRAMUSCULAR | Status: DC | PRN
  Filled 2012-03-19: qty 1

## 2012-03-19 MED ORDER — IOHEXOL 350 MG/ML SOLN
INTRAVENOUS | Status: DC | PRN
  Administered 2012-03-19: 7 mL

## 2012-03-19 MED ORDER — ACETAMINOPHEN 650 MG RE SUPP
650.0000 mg | RECTAL | Status: DC | PRN
  Filled 2012-03-19: qty 1

## 2012-03-19 MED ORDER — STERILE WATER FOR IRRIGATION IR SOLN
Status: DC | PRN
  Administered 2012-03-19: 3000 mL

## 2012-03-19 MED ORDER — SODIUM CHLORIDE 0.9 % IV SOLN
250.0000 mL | INTRAVENOUS | Status: DC | PRN
  Filled 2012-03-19: qty 250

## 2012-03-19 MED ORDER — CIPROFLOXACIN IN D5W 400 MG/200ML IV SOLN
400.0000 mg | INTRAVENOUS | Status: AC
  Administered 2012-03-19: 400 mg via INTRAVENOUS
  Filled 2012-03-19: qty 200

## 2012-03-19 MED ORDER — FLEET ENEMA 7-19 GM/118ML RE ENEM
1.0000 | ENEMA | Freq: Once | RECTAL | Status: DC
  Filled 2012-03-19: qty 1

## 2012-03-19 MED ORDER — FENTANYL CITRATE 0.05 MG/ML IJ SOLN
INTRAMUSCULAR | Status: DC | PRN
  Administered 2012-03-19 (×3): 25 ug via INTRAVENOUS
  Administered 2012-03-19 (×2): 50 ug via INTRAVENOUS
  Administered 2012-03-19: 25 ug via INTRAVENOUS

## 2012-03-19 MED ORDER — PROPOFOL 10 MG/ML IV BOLUS
INTRAVENOUS | Status: DC | PRN
  Administered 2012-03-19: 200 mg via INTRAVENOUS
  Administered 2012-03-19: 50 mg via INTRAVENOUS

## 2012-03-19 MED ORDER — ONDANSETRON HCL 4 MG/2ML IJ SOLN
4.0000 mg | Freq: Four times a day (QID) | INTRAMUSCULAR | Status: DC | PRN
  Filled 2012-03-19: qty 2

## 2012-03-19 MED ORDER — HYDROCODONE-ACETAMINOPHEN 5-500 MG PO CAPS: 1.0000 | ORAL_CAPSULE | ORAL | Status: DC | PRN

## 2012-03-19 MED ORDER — ACETAMINOPHEN 325 MG PO TABS
650.0000 mg | ORAL_TABLET | ORAL | Status: DC | PRN
  Filled 2012-03-19: qty 2

## 2012-03-19 MED ORDER — ACETAMINOPHEN 10 MG/ML IV SOLN
INTRAVENOUS | Status: DC | PRN
  Administered 2012-03-19: 1000 mg via INTRAVENOUS

## 2012-03-19 MED ORDER — HYDROMORPHONE HCL PF 1 MG/ML IJ SOLN
0.2500 mg | INTRAMUSCULAR | Status: DC | PRN
  Filled 2012-03-19: qty 1

## 2012-03-19 MED ORDER — LIDOCAINE HCL (CARDIAC) 20 MG/ML IV SOLN
INTRAVENOUS | Status: DC | PRN
  Administered 2012-03-19: 75 mg via INTRAVENOUS

## 2012-03-19 MED ORDER — ONDANSETRON HCL 4 MG/2ML IJ SOLN
INTRAMUSCULAR | Status: DC | PRN
  Administered 2012-03-19: 4 mg via INTRAVENOUS

## 2012-03-19 MED ORDER — METOCLOPRAMIDE HCL 5 MG/ML IJ SOLN
INTRAMUSCULAR | Status: DC | PRN
  Administered 2012-03-19: 10 mg via INTRAVENOUS

## 2012-03-19 MED ORDER — DEXAMETHASONE SODIUM PHOSPHATE 4 MG/ML IJ SOLN
INTRAMUSCULAR | Status: DC | PRN
  Administered 2012-03-19: 10 mg via INTRAVENOUS

## 2012-03-19 MED ORDER — SODIUM CHLORIDE 0.9 % IJ SOLN
3.0000 mL | Freq: Two times a day (BID) | INTRAMUSCULAR | Status: DC
  Filled 2012-03-19: qty 3

## 2012-03-19 SURGICAL SUPPLY — 26 items
BAG URINE DRAINAGE (UROLOGICAL SUPPLIES) ×3 IMPLANT
BLADE SURG ROTATE 9660 (MISCELLANEOUS) ×3 IMPLANT
CATH FOLEY 2WAY SLVR  5CC 16FR (CATHETERS) ×1
CATH FOLEY 2WAY SLVR 5CC 16FR (CATHETERS) ×2 IMPLANT
CATH ROBINSON RED A/P 20FR (CATHETERS) ×3 IMPLANT
CLOTH BEACON ORANGE TIMEOUT ST (SAFETY) ×3 IMPLANT
COVER MAYO STAND STRL (DRAPES) ×3 IMPLANT
COVER TABLE BACK 60X90 (DRAPES) ×3 IMPLANT
DRSG TEGADERM 4X4.75 (GAUZE/BANDAGES/DRESSINGS) ×3 IMPLANT
DRSG TEGADERM 8X12 (GAUZE/BANDAGES/DRESSINGS) ×3 IMPLANT
GAUZE SPONGE 4X4 12PLY STRL LF (GAUZE/BANDAGES/DRESSINGS) ×3 IMPLANT
GLOVE BIO SURGEON STRL SZ7.5 (GLOVE) ×3 IMPLANT
GLOVE BIO SURGEON STRL SZ8 (GLOVE) ×6 IMPLANT
GLOVE BIOGEL PI IND STRL 7.0 (GLOVE) ×2 IMPLANT
GLOVE BIOGEL PI INDICATOR 7.0 (GLOVE) ×1
GLOVE ECLIPSE 8.0 STRL XLNG CF (GLOVE) IMPLANT
GLOVE INDICATOR 6.5 STRL GRN (GLOVE) ×3 IMPLANT
GOWN PREVENTION PLUS LG XLONG (DISPOSABLE) ×3 IMPLANT
GOWN STRL REIN XL XLG (GOWN DISPOSABLE) ×3 IMPLANT
HOLDER FOLEY CATH W/STRAP (MISCELLANEOUS) ×3 IMPLANT
PACK CYSTOSCOPY (CUSTOM PROCEDURE TRAY) ×3 IMPLANT
SYRINGE 10CC LL (SYRINGE) ×3 IMPLANT
UNDERPAD 30X30 INCONTINENT (UNDERPADS AND DIAPERS) ×6 IMPLANT
WATER STERILE IRR 3000ML UROMA (IV SOLUTION) ×3 IMPLANT
WATER STERILE IRR 500ML POUR (IV SOLUTION) ×3 IMPLANT
nucletron selectseed ×3 IMPLANT

## 2012-03-19 NOTE — H&P (Signed)
Urology History and Physical Exam  CC: Prostate cancer  HPI: 61 year old male presents for I-125 brachytherapy to complete combination radiotherapy.  He recently presented with a PSA of 8.29. Prostate exam was basically normal, prostatic size was 75.8 cc. 11/12 biopsies returned adenocarcinoma. 10 revealed Gleason 3+3 pattern within the cores, one revealed Gleason 4+3 pattern. That was in the left base lateral.   CT of the abdomen and pelvis was negative for metastatic disease except for a 1.4 cm adrenal nodule which is most likely an adrenal adenoma. Bone scan was negative.  He has initiated androgen deprivation and has completed 25 treatments of external beam treatment.   PMH: Past Medical History  Diagnosis Date  . Prostate cancer 10/10/11 bx    Adenocarcinoma,gleason=3+3=6,& 4+3=7,volume=75.8cc,Psa=8.29  . Hypercholesterolemia   . Atherosclerosis   . HTN (hypertension)     not actively treated    PSH: Past Surgical History  Procedure Date  . Prostate biopsy 10/10/11    Adenocarcinoma,gleason= 3+3=6,& 4+3=7,volume=75.8cc,Psa=8.29    Allergies: No Known Allergies  Medications: Prescriptions prior to admission  Medication Sig Dispense Refill  . chlorhexidine (PERIDEX) 0.12 % solution       . Cyanocobalamin (VITAMIN B 12 PO) Take 1,000 mcg by mouth daily.      . fish oil-omega-3 fatty acids 1000 MG capsule Take 1 g by mouth daily.      . Tamsulosin HCl (FLOMAX) 0.4 MG CAPS       . vitamin C (ASCORBIC ACID) 500 MG tablet Take 500 mg by mouth daily.      . Zinc 100 MG TABS Take 100 mg by mouth daily.      . Multiple Vitamin (MULTIVITAMIN) tablet Take 1 tablet by mouth daily.      . silodosin (RAPAFLO) 8 MG CAPS capsule Take 8 mg by mouth daily with breakfast.         Social History: History   Social History  . Marital Status: Married    Spouse Name: Gaylyn Lambert    Number of Children: 0  . Years of Education: N/A   Occupational History  . 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 Smoking status: Current Every Day Smoker -- 0.8 packs/day for .5 years    Types: Cigarettes  . Smokeless tobacco: Never Used  . Alcohol Use: 0.0 oz/week     Comment: occasionally  . Drug Use: No  . Sexually Active: Not on file   Other Topics Concern  . Not on file   Social History Narrative  . No narrative on file    Family History: Family History  Problem Relation Age of Onset  . Prostate cancer Brother 54    seed implant  . Diabetes type II Mother     Lupus alive at age 25  . Alzheimer's disease Father     deceased age 35    Review of Systems: Positive: Slow stream Negative: .  A further 10 point review of systems was negative except what is listed in the HPI.  Physical Exam: @VITALS2 @ General: No acute distress.  Awake. Head:  Normocephalic.  Atraumatic. ENT:  EOMI.  Mucous membranes moist Neck:  Supple.  No lymphadenopathy. CV:  S1 present. S2 present. Regular rate. Pulmonary: Equal effort bilaterally.  Clear to auscultation bilaterally. Abdomen: Soft.  Non tender to palpation. Skin:  Normal turgor.  No visible rash. Extremity: No gross deformity of bilateral upper extremities.  No gross deformity of  bilateral lower extremities. Neurologic: Alert. Appropriate mood.    Studies:  No results found for this basename: HGB:2,WBC:2,PLT:2 in the last 72 hours  No results found for this basename: NA:2,K:2,CL:2,CO2:2,BUN:2,CREATININE:2,CALCIUM:2,MAGNESIUM:2,GFRNONAA:2,GFRAA:2 in the last 72 hours   No results found for this basename: PT:2,INR:2,APTT:2 in the last 72 hours   No components found with this basename: ABG:2    Assessment:  Prostate cancer  Plan: I-125 brachytherapy

## 2012-03-19 NOTE — Interval H&P Note (Signed)
History and Physical Interval Note:  03/19/2012 7:40 AM  Thomas Grant  has presented today for surgery, with the diagnosis of PROSTATE CANCER  The various methods of treatment have been discussed with the patient and family. After consideration of risks, benefits and other options for treatment, the patient has consented to  Procedure(s) (LRB) with comments: RADIOACTIVE SEED IMPLANT (N/A) -     seeds implanted CYSTO (N/A) - no seeds found in bladder as a surgical intervention .  The patient's history has been reviewed, patient examined, no change in status, stable for surgery.  I have reviewed the patient's chart and labs.  Questions were answered to the patient's satisfaction.     Chelsea Aus

## 2012-03-19 NOTE — Transfer of Care (Signed)
Immediate Anesthesia Transfer of Care Note  Patient: Thomas Grant  Procedure(s) Performed: Procedure(s) (LRB): RADIOACTIVE SEED IMPLANT (N/A) CYSTOSCOPY FLEXIBLE (N/A)  Patient Location: Patient transported to PACU with oxygen via face mask at 4 Liters / Min  Anesthesia Type: General  Level of Consciousness: awake and alert   Airway & Oxygen Therapy: Patient Spontanous Breathing and Patient connected to face mask oxygen  Post-op Assessment: Report given to PACU RN and Post -op Vital signs reviewed and stable  Post vital signs: Reviewed and stable  Dentition: Teeth and oropharynx remain in pre-op condition  Complications: No apparent anesthesia complications

## 2012-03-19 NOTE — Anesthesia Postprocedure Evaluation (Signed)
  Anesthesia Post-op Note  Patient: Thomas Grant  Procedure(s) Performed: Procedure(s) (LRB): RADIOACTIVE SEED IMPLANT (N/A) CYSTOSCOPY FLEXIBLE (N/A)  Patient Location: PACU  Anesthesia Type: General  Level of Consciousness: awake and alert   Airway and Oxygen Therapy: Patient Spontanous Breathing  Post-op Pain: mild  Post-op Assessment: Post-op Vital signs reviewed, Patient's Cardiovascular Status Stable, Respiratory Function Stable, Patent Airway and No signs of Nausea or vomiting  Last Vitals:  Filed Vitals:   03/19/12 0937  BP: 130/65  Pulse:   Temp: 36.6 C  Resp: 10    Post-op Vital Signs: stable   Complications: No apparent anesthesia complications

## 2012-03-19 NOTE — Op Note (Signed)
Preoperative diagnosis: Clinical stage TI C adenocarcinoma the prostate   Postoperative diagnosis: Same   Procedure: I-125 prostate seed implantation with Nucletron robotic implanter   Surgeon: Bertram Millard. Bowen Goyal M.D.  Radiation Oncologist:Manning  Anesthesia: Gen.   Indications: Patient  was diagnosed with clinical stage TI C prostate cancer. We had extensive discussion with him about treatment options versus. He elected to proceed with combination radiotherapy. He underwent consultation my office as well as with Dr. Kathrynn Running. He appeared to understand the advantages disadvantages potential risks of this treatment option. Full informed consent has been obtained.   Technique and findings: Patient was brought the operating room where he had successful induction of general anesthesia. He was placed in dorso-lithotomy position and prepped and draped in usual manner. Appropriate surgical timeout was performed. Radiation oncology department placed a transrectal ultrasound probe anchoring stand. Foley catheter with contrast in the balloon was inserted without difficulty. Anchoring needles were placed within the prostate. Rectal tube was placed. Real-time contouring of the urethra prostate and rectum were performed and the dosing parameters were established. Targeted dose was 110.00 gray.  I was then called  to the operating suite suite for placement of the needles. A second timeout was performed. All needle passage was done with real-time transrectal ultrasound guidance with the sagittal plane. A total of 21 needles were placed. The implantation itself was done with the robotic implanter. 71 active seeds were implanted. A Foley catheter was removed and flexible cystoscopy failed to show any seeds outside the prostate.  The patient was brought to recovery room in stable condition.

## 2012-03-19 NOTE — Anesthesia Procedure Notes (Signed)
Procedure Name: LMA Insertion Date/Time: 03/19/2012 7:48 AM Performed by: Fran Lowes Pre-anesthesia Checklist: Patient identified, Emergency Drugs available, Suction available and Patient being monitored Patient Re-evaluated:Patient Re-evaluated prior to inductionOxygen Delivery Method: Circle System Utilized Preoxygenation: Pre-oxygenation with 100% oxygen Intubation Type: IV induction Ventilation: Mask ventilation without difficulty LMA: LMA inserted LMA Size: 4.0 Number of attempts: 1 Airway Equipment and Method: bite block Placement Confirmation: positive ETCO2 Tube secured with: Tape Dental Injury: Teeth and Oropharynx as per pre-operative assessment

## 2012-03-20 ENCOUNTER — Encounter (HOSPITAL_BASED_OUTPATIENT_CLINIC_OR_DEPARTMENT_OTHER): Payer: Self-pay | Admitting: Urology

## 2012-03-22 NOTE — Op Note (Addendum)
  Radiation Oncology         [phone]) [phone] ________________________________  Name: Jamian Andujo MRN: [id number]  Date: 03/19/2012  DOB: July 18, 1950       Prostate Seed Implant  DIAGNOSIS: 61 year old gentleman with stage T1c adenocarcinoma prostate with Gleason score of 4+3 and a PSA of 8.29  PROCEDURE: Insertion of radioactive I-125 seeds into the prostate gland.  RADIATION DOSE: 110 Gy, boost therapy after 45 Gy external beam  TECHNIQUE: Waverly Chavarria was brought to the operating room with the urologist. He was placed in the dorsolithotomy position. He was catheterized and a rectal tube was inserted. The perineum was shaved, prepped and draped. The ultrasound probe was then introduced into the rectum to see the prostate gland.  TREATMENT DEVICE: A needle grid was attached to the ultrasound probe stand and anchor needles were placed.  COMPLEX ISODOSE CALCULATION: The prostate was imaged in 3D using a sagittal sweep of the prostate probe. These images were transferred to the planning computer. There, the prostate, urethra and rectum were defined on each axial reconstructed image. Then, the software created an optimized plan and a few seed positions were adjusted. Then the accepted plan was uploaded to the seed Selectron afterloading unit.  SPECIAL TREATMENT PROCEDURE/SUPERVISION AND HANDLING: The Nucletron FIRST system was used to place the needles under sagittal guidance. A total of 23 needles were used to deposit 71 seeds in the prostate gland. The individual seed activity was 0.464 mCi for a total implant activity of 32.944 mCi.  COMPLEX SIMULATION: At the end of the procedure, an anterior radiograph of the pelvis was obtained to document seed positioning and count. Cystoscopy was performed to check the urethra and bladder.  MICRODOSIMETRY: At the end of the procedure, the patient was emitting 0.15 mrem/hr at 1 meter. Accordingly, he was considered safe for hospital discharge.  PLAN:  The patient will return to the radiation oncology clinic for post implant CT dosimetry in three weeks.  ________________________________  Artist Pais Kathrynn Running, M.D.

## 2012-04-03 DIAGNOSIS — C61 Malignant neoplasm of prostate: Secondary | ICD-10-CM | POA: Insufficient documentation

## 2012-04-03 DIAGNOSIS — E7800 Pure hypercholesterolemia, unspecified: Secondary | ICD-10-CM | POA: Insufficient documentation

## 2012-04-03 DIAGNOSIS — I10 Essential (primary) hypertension: Secondary | ICD-10-CM | POA: Insufficient documentation

## 2012-04-03 DIAGNOSIS — I7090 Unspecified atherosclerosis: Secondary | ICD-10-CM | POA: Insufficient documentation

## 2012-04-07 ENCOUNTER — Telehealth: Payer: Self-pay | Admitting: Radiation Oncology

## 2012-04-07 NOTE — Telephone Encounter (Signed)
[employer] claim forms given to MM today for completion.

## 2012-04-09 ENCOUNTER — Inpatient Hospital Stay: Admission: RE | Admit: 2012-04-09 | Payer: Self-pay | Source: Ambulatory Visit | Admitting: Radiation Oncology

## 2012-04-09 ENCOUNTER — Ambulatory Visit: Payer: Managed Care, Other (non HMO) | Admitting: Radiation Oncology

## 2012-04-14 ENCOUNTER — Telehealth: Payer: Self-pay | Admitting: Radiation Oncology

## 2012-04-14 NOTE — Telephone Encounter (Signed)
[employer] disability forms COMPLETE. I spoke w patient and he advised to extend his disability 03/19/2012 - 05/01/2012. Forms faxed today.  Ph: [phone] Fx: [phone]   **Copy Scanned**

## 2012-04-16 ENCOUNTER — Encounter: Payer: Self-pay | Admitting: Radiation Oncology

## 2012-04-21 ENCOUNTER — Encounter (HOSPITAL_COMMUNITY): Payer: Self-pay

## 2012-04-21 ENCOUNTER — Emergency Department (HOSPITAL_COMMUNITY)
Admission: EM | Admit: 2012-04-21 | Discharge: 2012-04-21 | Disposition: A | Discharge status: Home / Self Care | Payer: Managed Care, Other (non HMO) | Attending: Emergency Medicine | Admitting: Emergency Medicine

## 2012-04-21 DIAGNOSIS — R339 Retention of urine, unspecified: Secondary | ICD-10-CM | POA: Insufficient documentation

## 2012-04-21 DIAGNOSIS — E7800 Pure hypercholesterolemia, unspecified: Secondary | ICD-10-CM | POA: Insufficient documentation

## 2012-04-21 DIAGNOSIS — R197 Diarrhea, unspecified: Secondary | ICD-10-CM | POA: Insufficient documentation

## 2012-04-21 DIAGNOSIS — R109 Unspecified abdominal pain: Secondary | ICD-10-CM | POA: Insufficient documentation

## 2012-04-21 DIAGNOSIS — Z79899 Other long term (current) drug therapy: Secondary | ICD-10-CM | POA: Insufficient documentation

## 2012-04-21 DIAGNOSIS — R338 Other retention of urine: Secondary | ICD-10-CM

## 2012-04-21 DIAGNOSIS — N289 Disorder of kidney and ureter, unspecified: Secondary | ICD-10-CM | POA: Insufficient documentation

## 2012-04-21 DIAGNOSIS — R300 Dysuria: Secondary | ICD-10-CM | POA: Insufficient documentation

## 2012-04-21 DIAGNOSIS — I10 Essential (primary) hypertension: Secondary | ICD-10-CM | POA: Insufficient documentation

## 2012-04-21 DIAGNOSIS — F17200 Nicotine dependence, unspecified, uncomplicated: Secondary | ICD-10-CM | POA: Insufficient documentation

## 2012-04-21 DIAGNOSIS — Z8546 Personal history of malignant neoplasm of prostate: Secondary | ICD-10-CM | POA: Insufficient documentation

## 2012-04-21 DIAGNOSIS — Z923 Personal history of irradiation: Secondary | ICD-10-CM | POA: Insufficient documentation

## 2012-04-21 LAB — CBC WITH DIFFERENTIAL/PLATELET
Basophils Relative: 0 % (ref 0–1)
Eosinophils Absolute: 0.2 10*3/uL (ref 0.0–0.7)
HCT: 33.6 % — ABNORMAL LOW (ref 39.0–52.0)
Hemoglobin: 11.2 g/dL — ABNORMAL LOW (ref 13.0–17.0)
Lymphs Abs: 1.5 10*3/uL (ref 0.7–4.0)
MCH: 31 pg (ref 26.0–34.0)
MCHC: 33.3 g/dL (ref 30.0–36.0)
Monocytes Absolute: 1.4 10*3/uL — ABNORMAL HIGH (ref 0.1–1.0)
Monocytes Relative: 14 % — ABNORMAL HIGH (ref 3–12)
Neutro Abs: 6.8 10*3/uL (ref 1.7–7.7)
Neutrophils Relative %: 68 % (ref 43–77)
RBC: 3.61 MIL/uL — ABNORMAL LOW (ref 4.22–5.81)

## 2012-04-21 LAB — POCT I-STAT, CHEM 8
BUN: 25 mg/dL — ABNORMAL HIGH (ref 6–23)
Calcium, Ion: 1.17 mmol/L (ref 1.13–1.30)
Creatinine, Ser: 1.9 mg/dL — ABNORMAL HIGH (ref 0.50–1.35)
Glucose, Bld: 99 mg/dL (ref 70–99)
Hemoglobin: 11.2 g/dL — ABNORMAL LOW (ref 13.0–17.0)
Sodium: 141 mEq/L (ref 135–145)
TCO2: 21 mmol/L (ref 0–100)

## 2012-04-21 LAB — URINALYSIS, MICROSCOPIC ONLY
Bilirubin Urine: NEGATIVE
Ketones, ur: NEGATIVE mg/dL
Leukocytes, UA: NEGATIVE
Nitrite: NEGATIVE
Urobilinogen, UA: 0.2 mg/dL (ref 0.0–1.0)
pH: 5.5 (ref 5.0–8.0)

## 2012-04-21 MED ORDER — HYDROMORPHONE HCL PF 1 MG/ML IJ SOLN
1.0000 mg | Freq: Once | INTRAMUSCULAR | Status: AC
  Administered 2012-04-21: 1 mg via INTRAVENOUS
  Filled 2012-04-21: qty 1

## 2012-04-21 MED ORDER — ONDANSETRON HCL 4 MG/2ML IJ SOLN
4.0000 mg | Freq: Once | INTRAMUSCULAR | Status: AC
  Administered 2012-04-21: 4 mg via INTRAVENOUS
  Filled 2012-04-21: qty 2

## 2012-04-21 MED ORDER — OXYBUTYNIN CHLORIDE 5 MG PO TABS
5.0000 mg | ORAL_TABLET | Freq: Once | ORAL | Status: AC
  Administered 2012-04-21: 5 mg via ORAL
  Filled 2012-04-21: qty 1

## 2012-04-21 MED ORDER — HYDROCODONE-ACETAMINOPHEN 5-325 MG PO TABS: 1.0000 | ORAL_TABLET | Freq: Four times a day (QID) | ORAL | Status: DC | PRN

## 2012-04-21 NOTE — ED Provider Notes (Signed)
History     CSN: [id number]  Arrival date & time 04/21/12  1119   First MD Initiated Contact with Patient 04/21/12 1532      Chief Complaint  Patient presents with  . Urinary Retention  . Flank Pain  . abdominal distention     (Consider location/radiation/quality/duration/timing/severity/associated sxs/prior treatment) Patient is a 62 y.o. male presenting with flank pain. The history is provided by the patient.  Flank Pain This is a new problem. Episode onset: Approximately 2 weeks ago. Episode frequency: Waxing and waning throughout the day. Worse at night. The problem has been gradually worsening. Associated symptoms include abdominal pain. Pertinent negatives include no shortness of breath. Associated symptoms comments: No fever. Difficulty urinating for the last 2 weeks. Hesitation prior to urinating and then only small streams of urine. He has not felt that he is completely empty his bladder for the last 2 weeks. Nothing aggravates the symptoms. Nothing relieves the symptoms. Treatments tried: Laxatives. The treatment provided no relief.    Past Medical History  Diagnosis Date  . Prostate cancer 10/10/11 bx    Adenocarcinoma,gleason=3+3=6,& 4+3=7,volume=75.8cc,Psa=8.29  . Hypercholesterolemia   . Atherosclerosis   . HTN (hypertension)     not actively treated  . History of radiation therapy 01/21/2012- 11/25    Prostate 45Gy/84fxs,of 1.8Gy    Past Surgical History  Procedure Date  . Prostate biopsy 10/10/11    Adenocarcinoma,gleason= 3+3=6,& 4+3=7,volume=75.8cc,Psa=8.29  . Radioactive seed implant 03/19/2012    Procedure: RADIOACTIVE SEED IMPLANT;  Surgeon: Marcine Matar, MD;  Location: [facility];  Service: Urology;  Laterality: N/A;  71  seeds implanted  . Cystoscopy 03/19/2012    Procedure: CYSTOSCOPY FLEXIBLE;  Surgeon: Marcine Matar, MD;  Location[facility];  Service: Urology;  Laterality: N/A;  no seeds found in bladder     Family History  Problem Relation Age of Onset  . Prostate cancer Brother 54    seed implant  . Diabetes type II Mother     Lupus alive at age 34  . Alzheimer's disease Father     deceased age 90    History  Substance Use Topics  . Smoking status: Current Every Day Smoker -- 0.8 packs/day for .5 years    Types: Cigarettes  . Smokeless tobacco: Never Used  . Alcohol Use: 0.0 oz/week     Comment: occasionally      Review of Systems  Constitutional: Negative for fever and chills.  Respiratory: Negative for shortness of breath.   Gastrointestinal: Positive for abdominal pain and diarrhea. Negative for nausea, vomiting and constipation.  Genitourinary: Positive for urgency, frequency and flank pain.  All other systems reviewed and are negative.    Allergies  Review of patient's allergies indicates no known allergies.  Home Medications   Current Outpatient Rx  Name  Route  Sig  Dispense  Refill  . VITAMIN B 12 PO   Oral   Take 1,000 mcg by mouth daily.         . OMEGA-3 FATTY ACIDS 1000 MG PO CAPS   Oral   Take 1 g by mouth daily.         . IBUPROFEN 200 MG PO TABS   Oral   Take 200 mg by mouth every 6 (six) hours as needed. pain         . ONE-DAILY MULTI VITAMINS PO TABS   Oral   Take 1 tablet by mouth daily.         Marland Kitchen  SILODOSIN 8 MG PO CAPS   Oral   Take 8 mg by mouth daily with breakfast.         . TAMSULOSIN HCL 0.4 MG PO CAPS               . VITAMIN C 500 MG PO TABS   Oral   Take 500 mg by mouth daily.         Marland Kitchen ZINC 100 MG PO TABS   Oral   Take 100 mg by mouth daily.           BP 149/83  Pulse 90  Temp 98.3 F (36.8 C) (Oral)  Resp 18  SpO2 98%  Physical Exam  Nursing note and vitals reviewed. Constitutional: He is oriented to person, place, and time. He appears well-developed and well-nourished. No distress.  HENT:  Head: Normocephalic and atraumatic.  Mouth/Throat: Oropharynx is clear and moist.  Eyes:  Conjunctivae normal and EOM are normal. Pupils are equal, round, and reactive to light.  Neck: Normal range of motion. Neck supple.  Cardiovascular: Normal rate, regular rhythm and intact distal pulses.   No murmur heard. Pulmonary/Chest: Effort normal and breath sounds normal. No respiratory distress. He has no wheezes. He has no rales.  Abdominal: Soft. He exhibits mass. He exhibits no distension. There is no tenderness. There is CVA tenderness. There is no rebound and no guarding.       Mass palpated up to the umbilicus that is firm  Musculoskeletal: Normal range of motion. He exhibits no edema and no tenderness.  Neurological: He is alert and oriented to person, place, and time.  Skin: Skin is warm and dry. No rash noted. No erythema.  Psychiatric: He has a normal mood and affect. His behavior is normal.    ED Course  Procedures (including critical care time)  Labs Reviewed  CBC WITH DIFFERENTIAL - Abnormal; Notable for the following:    RBC 3.61 (*)     Hemoglobin 11.2 (*)     HCT 33.6 (*)     Monocytes Relative 14 (*)     Monocytes Absolute 1.4 (*)     All other components within normal limits  URINALYSIS, MICROSCOPIC ONLY - Abnormal; Notable for the following:    Bacteria, UA FEW (*)     All other components within normal limits  POCT I-STAT, CHEM 8 - Abnormal; Notable for the following:    BUN 25 (*)     Creatinine, Ser 1.90 (*)     Hemoglobin 11.2 (*)     HCT 33.0 (*)     All other components within normal limits  URINE CULTURE   No results found.   Date: 04/21/2012  Rate: 59  Rhythm: normal sinus rhythm  QRS Axis: normal  Intervals: normal  ST/T Wave abnormalities: nonspecific T wave changes  Conduction Disutrbances:none  Narrative Interpretation:   Old EKG Reviewed: unchanged    No diagnosis found.    MDM   Patient with a history of prostate cancer who recently had radioactive seed implants done in the end of December. He states for the last 2 weeks  he has had urinary retention and abdominal distention. He spoke with his urologist who stated that he was constipated.  Patient has been using magnesium citrate and having loose stools but his abdominal distention and flank pain is not improving. On exam patient has a mass palpated up into his umbilicus. Bedside ultrasound shows a grossly distended bladder with 1-2 L of urine  present. Patient has normal vital signs and appears fairly comfortable given his urinary retention. Prior to doing the ultrasound he had just urinated. He states he only gets small amounts of urine. He is denying any infectious symptoms. He denies any signs of fluid overload. CBC, i-STAT, UA pending. Foley catheter will be placed for acute urinary retention to  5:56 PM Patient with a post obstructive nephropathy with elevation his creatinine to 1.9. Foley catheter placed and 2200 mL drained.  Patient is currently feeling much better.  UA is within normal limits without signs of infection. Will keep Foley in place and have him followup with Dr. Lenn Sink for removal.      Gwyneth Sprout, MD 04/21/12 1756

## 2012-04-21 NOTE — ED Notes (Signed)
Patient c/o abdominal distention 5 days ago. Patient called his urologist and was told to take stool softeners and laxatives. Patient had very little results. Patient states pain in the abdomen and flank areas has worsened and is unrelieved by Ibuprofen. Patient has a history of prostate cancer. Patient has had urinary retention, but is worse since he has been having the abdominal distention.

## 2012-04-22 ENCOUNTER — Telehealth: Payer: Self-pay | Admitting: *Deleted

## 2012-04-22 LAB — URINE CULTURE: Culture: NO GROWTH

## 2012-04-22 NOTE — Telephone Encounter (Signed)
CALLED PATIENT TO INFORM THAT HE COULD COME IN FOR HIS TEST AND VISIT TOMORROW, SPOKE WITH THIS PATIENT AND HE IS AWARE OF THIS.

## 2012-04-23 ENCOUNTER — Encounter: Payer: Self-pay | Admitting: Radiation Oncology

## 2012-04-23 ENCOUNTER — Ambulatory Visit
Admission: RE | Admit: 2012-04-23 | Discharge: 2012-04-23 | Disposition: A | Discharge status: Home / Self Care | Payer: Managed Care, Other (non HMO) | Source: Ambulatory Visit | Attending: Radiation Oncology | Admitting: Radiation Oncology

## 2012-04-23 VITALS — BP 134/73 | HR 92 | Temp 97.7°F | Resp 20 | Wt 152.1 lb

## 2012-04-23 DIAGNOSIS — C61 Malignant neoplasm of prostate: Secondary | ICD-10-CM

## 2012-04-23 NOTE — Progress Notes (Signed)
Patient here follow up post seed implant prostate  03/19/12 71 seeds Patient alert,oriented x3, has foley bag   From urinary retention was at hospital  04/21/12, patient still has slight pain where catheter is, last bowel movement Monday stated,  IPSS= [id number] between flomax and rapaflo stated 8:33 AM

## 2012-04-23 NOTE — Progress Notes (Signed)
  Radiation Oncology         [phone]) [phone] ________________________________  Name: Thomas Grant MRN: [id number]  Date: 04/23/2012  DOB: 20-Mar-1951  COMPLEX SIMULATION NOTE  NARRATIVE:  The patient was brought to the CT Simulation planning suite today following prostate seed implantation approximately one month ago.  Identity was confirmed.  All relevant records and images related to the planned course of therapy were reviewed.  Then, the patient was set-up supine.  CT images were obtained.  The CT images were loaded into the planning software.  Then the prostate and rectum were contoured.  The urethra was also contoured given the presence of a Foley catheter.  Treatment planning then occurred.  The implanted iodine 125 seeds were identified by the physics staff for projection of radiation distribution  I have requested : 3D Simulation  I have requested a DVH of the following structures: Prostate, urethra and rectum.    ________________________________  Artist Pais Kathrynn Running, M.D.

## 2012-04-23 NOTE — Progress Notes (Signed)
Radiation Oncology         [phone] ________________________________  Name: Thomas Grant MRN: [id number]  Date: 04/23/2012  DOB: December 11, 1950  Follow-Up Visit Note  CC: No primary provider on file.  Marcine Matar, MD  Diagnosis:   62 year old gentleman with stage T1c adenocarcinoma prostate with Gleason score of 4+3 and a PSA of 8.29 treated with combined external beam radiotherapy to 45 Gy and then seed boost to 110 Gy  Interval Since Last Radiation:  1 months  Narrative:  The patient returns today for routine follow-up.  He is complaining of increased urinary frequency and urinary hesitation symptoms. He filled out a questionnaire regarding urinary function today providing and overall IPSS score of 33 characterizing his symptoms as severe.  He actually developed urinary obstruction requiring placement of a Foley catheter in the ER 2 days ago.  ALLERGIES:   has no known allergies.  Meds: Current Outpatient Prescriptions  Medication Sig Dispense Refill  . HYDROcodone-acetaminophen (NORCO/VICODIN) 5-325 MG per tablet Take 1 tablet by mouth every 6 (six) hours as needed for pain.  15 tablet  0  . ibuprofen (ADVIL,MOTRIN) 200 MG tablet Take 200 mg by mouth every 6 (six) hours as needed. pain      . Multiple Vitamin (MULTIVITAMIN) tablet Take 1 tablet by mouth daily.      . silodosin (RAPAFLO) 8 MG CAPS capsule Take 8 mg by mouth daily with breakfast.      . Tamsulosin HCl (FLOMAX) 0.4 MG CAPS       . vitamin C (ASCORBIC ACID) 500 MG tablet Take 500 mg by mouth daily.      . Zinc 100 MG TABS Take 100 mg by mouth daily.      . Cyanocobalamin (VITAMIN B 12 PO) Take 1,000 mcg by mouth daily.      . fish oil-omega-3 fatty acids 1000 MG capsule Take 1 g by mouth daily.        Physical Findings: The patient is in no acute distress. Patient is alert and oriented.  weight is 152 lb 1.6 oz (68.992 kg). His oral temperature is 97.7 F (36.5 C). His blood pressure is 134/73 and his  pulse is 92. His respiration is 20. Marland Kitchen  No significant changes.  Lab Findings: Lab Results  Component Value Date   WBC 10.0 04/21/2012   HGB 11.2* 04/21/2012   HCT 33.0* 04/21/2012   MCV 93.1 04/21/2012   PLT 342 04/21/2012    Radiographic Findings:  Patient underwent CT imaging in our clinic for post implant dosimetry. The CT appears to demonstrate an adequate distribution of radioactive seeds throughout the prostate gland. There no seeds in her near the rectum. I suspect the final radiation plan and dosimetry will show appropriate coverage of the prostate gland.  Given the presence of the Foley, I will be able to calculate urethral dosimetry in addition to the standard rectal and prostate doses.  Impression: The patient is suffering from the effects of radiation. His urinary symptoms should gradually improve over the next 4-6 months. We talked about this today. He is encouraged by that.  Plan: Today, I spent time talking to the patient about his prostate seed implant and urinary symptoms. We discussed management of his urinary obstructive symptoms. At this point, it is hoped that his Foley catheter may be able to be removed in the near future.   The patient could potentially benefit from Avodart and NSAIDS (pending normalization of his creatinine) in the short-term. In the  long-term, the patient could potentially benefit from channel TURP, but this must be delayed to at least 6 months after seed implantation to minimize his risk for urinary incontinence.  We talked about long-term followup for prostate cancer following seed implant. He understands that ongoing PSA determinations and digital rectal exams will help perform surveillance to rule out disease recurrence. He understands what to expect with his PSA measures.   Because of his emergent urinary obstruction, the patient's planned followup visit with Dr. Retta Diones Feb 19 will be moved up and he may actually see Dr. Lenoria Chime PA later today.     _____________________________________  Artist Pais. Kathrynn Running, M.D.

## 2012-04-28 ENCOUNTER — Telehealth: Payer: Self-pay | Admitting: Radiation Oncology

## 2012-04-28 NOTE — Telephone Encounter (Signed)
[employer] Short-Term disability forms sent to rad onc nurse Liborio Nixon) for clinical review and then forwarded to  Dr. Kathrynn Running for completion.  [employer] Disability  Claim Nbr:  [id number]  Fx: [phone] Ph: [phone]

## 2012-05-04 ENCOUNTER — Telehealth: Payer: Self-pay | Admitting: Radiation Oncology

## 2012-05-04 NOTE — Telephone Encounter (Signed)
[employer] Short-Term Disability forms and clinicals complete and faxed to:  [phone]  Ph: [phone]  CLAIM NBR:  [id number]   COPY SCANNED

## 2012-05-16 ENCOUNTER — Other Ambulatory Visit: Payer: Self-pay

## 2012-06-08 ENCOUNTER — Encounter: Payer: Self-pay | Admitting: Radiation Oncology

## 2012-06-10 ENCOUNTER — Ambulatory Visit
Admission: RE | Admit: 2012-06-10 | Discharge: 2012-06-10 | Disposition: A | Discharge status: Home / Self Care | Payer: Managed Care, Other (non HMO) | Source: Ambulatory Visit | Attending: Radiation Oncology | Admitting: Radiation Oncology

## 2012-06-10 DIAGNOSIS — C61 Malignant neoplasm of prostate: Secondary | ICD-10-CM | POA: Insufficient documentation

## 2012-07-05 NOTE — Progress Notes (Unsigned)
  Radiation Oncology         [phone]) [phone] ________________________________  Name: Ajeet Casasola MRN: [id number]  Date: 06/08/2012  DOB: May 24, 1950  3-D Planning Note Prostate Brachytherapy  Diagnosis: 62 year old gentleman with stage T1c adenocarcinoma prostate with Gleason score of 4+3 and a PSA of 8.29 treated with combined external beam radiotherapy to 45 Gy and then seed boost to 110 Gy  Narrative: Wilfred Lacy returned following prostate seed implantation for post implant planning. He underwent CT scan complex simulation to delineate the three-dimensional structures of the pelvis and demonstrate the radiation distribution.  Results:   Prostate Coverage - The dose of radiation delivered to the 90% or more of the prostate gland (D90) was 115.62% of the prescription dose. This exceeds our goal of greater than 90%. Rectal Sparing - The volume of rectal tissue receiving the prescription dose or higher was 0.62 cc. This falls under our thresholds tolerance of 1.0 cc.  Impression: The prostate seed implant appears to show adequate target coverage and appropriate rectal sparing.  Plan:  The patient will continue to follow with urology for ongoing PSA determinations. I would anticipate a high likelihood for local tumor control with minimal risk for rectal morbidity.  ________________________________  Artist Pais Kathrynn Running, M.D.

## 2012-08-27 ENCOUNTER — Other Ambulatory Visit: Payer: Self-pay | Admitting: Urology

## 2012-08-27 ENCOUNTER — Encounter (HOSPITAL_BASED_OUTPATIENT_CLINIC_OR_DEPARTMENT_OTHER): Payer: Self-pay | Admitting: *Deleted

## 2012-08-28 ENCOUNTER — Encounter (HOSPITAL_BASED_OUTPATIENT_CLINIC_OR_DEPARTMENT_OTHER): Payer: Self-pay | Admitting: *Deleted

## 2012-08-28 NOTE — Progress Notes (Signed)
NPO AFTER MN. ARRIVES AT 0715. NEEDS ISTAT 8. REVIEWED RCC GUIDELINES.

## 2012-08-31 ENCOUNTER — Ambulatory Visit (HOSPITAL_BASED_OUTPATIENT_CLINIC_OR_DEPARTMENT_OTHER): Payer: Managed Care, Other (non HMO) | Admitting: Anesthesiology

## 2012-08-31 ENCOUNTER — Encounter (HOSPITAL_BASED_OUTPATIENT_CLINIC_OR_DEPARTMENT_OTHER)
Admission: RE | Disposition: A | Discharge status: Home / Self Care | Payer: Self-pay | Source: Ambulatory Visit | Attending: Urology

## 2012-08-31 ENCOUNTER — Ambulatory Visit (HOSPITAL_BASED_OUTPATIENT_CLINIC_OR_DEPARTMENT_OTHER)
Admission: RE | Admit: 2012-08-31 | Discharge: 2012-09-01 | Disposition: A | Discharge status: Home / Self Care | Payer: Managed Care, Other (non HMO) | Source: Ambulatory Visit | Attending: Urology | Admitting: Urology

## 2012-08-31 ENCOUNTER — Encounter (HOSPITAL_BASED_OUTPATIENT_CLINIC_OR_DEPARTMENT_OTHER): Payer: Self-pay | Admitting: Anesthesiology

## 2012-08-31 ENCOUNTER — Encounter (HOSPITAL_BASED_OUTPATIENT_CLINIC_OR_DEPARTMENT_OTHER): Payer: Self-pay | Admitting: *Deleted

## 2012-08-31 DIAGNOSIS — R339 Retention of urine, unspecified: Secondary | ICD-10-CM | POA: Insufficient documentation

## 2012-08-31 DIAGNOSIS — Z79899 Other long term (current) drug therapy: Secondary | ICD-10-CM | POA: Insufficient documentation

## 2012-08-31 DIAGNOSIS — I7090 Unspecified atherosclerosis: Secondary | ICD-10-CM | POA: Insufficient documentation

## 2012-08-31 DIAGNOSIS — I10 Essential (primary) hypertension: Secondary | ICD-10-CM | POA: Insufficient documentation

## 2012-08-31 DIAGNOSIS — F17200 Nicotine dependence, unspecified, uncomplicated: Secondary | ICD-10-CM | POA: Insufficient documentation

## 2012-08-31 DIAGNOSIS — E7800 Pure hypercholesterolemia, unspecified: Secondary | ICD-10-CM | POA: Insufficient documentation

## 2012-08-31 DIAGNOSIS — N3289 Other specified disorders of bladder: Secondary | ICD-10-CM | POA: Insufficient documentation

## 2012-08-31 DIAGNOSIS — C61 Malignant neoplasm of prostate: Secondary | ICD-10-CM | POA: Insufficient documentation

## 2012-08-31 DIAGNOSIS — Z923 Personal history of irradiation: Secondary | ICD-10-CM | POA: Insufficient documentation

## 2012-08-31 HISTORY — PX: TRANSURETHRAL RESECTION OF PROSTATE: SHX73

## 2012-08-31 HISTORY — DX: Benign prostatic hyperplasia with lower urinary tract symptoms: N40.1

## 2012-08-31 HISTORY — DX: Other retention of urine: R33.8

## 2012-08-31 HISTORY — DX: Other specified health status: Z78.9

## 2012-08-31 LAB — POCT I-STAT, CHEM 8
Calcium, Ion: 1.26 mmol/L (ref 1.13–1.30)
Glucose, Bld: 96 mg/dL (ref 70–99)
HCT: 41 % (ref 39.0–52.0)
Hemoglobin: 13.9 g/dL (ref 13.0–17.0)
TCO2: 25 mmol/L (ref 0–100)

## 2012-08-31 SURGERY — TRANSURETHRAL RESECTION OF THE PROSTATE WITH GYRUS INSTRUMENTS
Anesthesia: General | Site: Prostate | Wound class: Clean Contaminated

## 2012-08-31 MED ORDER — BELLADONNA ALKALOIDS-OPIUM 16.2-60 MG RE SUPP
RECTAL | Status: DC | PRN
  Administered 2012-08-31: 1 via RECTAL

## 2012-08-31 MED ORDER — FENTANYL CITRATE 0.05 MG/ML IJ SOLN
INTRAMUSCULAR | Status: DC | PRN
  Administered 2012-08-31: 25 ug via INTRAVENOUS
  Administered 2012-08-31 (×2): 50 ug via INTRAVENOUS
  Administered 2012-08-31: 25 ug via INTRAVENOUS

## 2012-08-31 MED ORDER — HYDROCODONE-ACETAMINOPHEN 5-325 MG PO TABS
1.0000 | ORAL_TABLET | ORAL | Status: DC | PRN
  Administered 2012-08-31 – 2012-09-01 (×2): 1 via ORAL
  Filled 2012-08-31: qty 2

## 2012-08-31 MED ORDER — LACTATED RINGERS IV SOLN
INTRAVENOUS | Status: DC
  Administered 2012-08-31 (×2): via INTRAVENOUS
  Filled 2012-08-31: qty 1000

## 2012-08-31 MED ORDER — PROPOFOL 10 MG/ML IV BOLUS
INTRAVENOUS | Status: DC | PRN
  Administered 2012-08-31: 200 mg via INTRAVENOUS

## 2012-08-31 MED ORDER — SODIUM CHLORIDE 0.9 % IR SOLN
Status: DC | PRN
  Administered 2012-08-31: 6000 mL via INTRAVESICAL

## 2012-08-31 MED ORDER — CIPROFLOXACIN HCL 250 MG PO TABS: 250.0000 mg | ORAL_TABLET | Freq: Two times a day (BID) | ORAL | Status: DC

## 2012-08-31 MED ORDER — ONDANSETRON HCL 4 MG/2ML IJ SOLN
INTRAMUSCULAR | Status: DC | PRN
  Administered 2012-08-31: 4 mg via INTRAVENOUS

## 2012-08-31 MED ORDER — PROMETHAZINE HCL 25 MG/ML IJ SOLN
6.2500 mg | INTRAMUSCULAR | Status: DC | PRN
  Filled 2012-08-31: qty 1

## 2012-08-31 MED ORDER — ONDANSETRON HCL 4 MG/2ML IJ SOLN
4.0000 mg | INTRAMUSCULAR | Status: DC | PRN
Start: 2012-08-31 — End: 2012-09-01
  Filled 2012-08-31: qty 2

## 2012-08-31 MED ORDER — LIDOCAINE HCL (CARDIAC) 20 MG/ML IV SOLN
INTRAVENOUS | Status: DC | PRN
  Administered 2012-08-31: 60 mg via INTRAVENOUS

## 2012-08-31 MED ORDER — DOCUSATE SODIUM 100 MG PO CAPS
100.0000 mg | ORAL_CAPSULE | Freq: Two times a day (BID) | ORAL | Status: DC
  Administered 2012-08-31 (×2): 100 mg via ORAL
  Filled 2012-08-31: qty 1

## 2012-08-31 MED ORDER — SULFAMETHOXAZOLE-TMP DS 800-160 MG PO TABS
1.0000 | ORAL_TABLET | Freq: Two times a day (BID) | ORAL | Status: DC
  Administered 2012-08-31 (×3): 1 via ORAL
  Filled 2012-08-31: qty 1

## 2012-08-31 MED ORDER — DEXAMETHASONE SODIUM PHOSPHATE 4 MG/ML IJ SOLN
INTRAMUSCULAR | Status: DC | PRN
  Administered 2012-08-31: 10 mg via INTRAVENOUS

## 2012-08-31 MED ORDER — GENTAMICIN SULFATE 40 MG/ML IJ SOLN
160.0000 mg | INTRAVENOUS | Status: AC
  Administered 2012-08-31: 340 mg via INTRAVENOUS
  Filled 2012-08-31: qty 4

## 2012-08-31 MED ORDER — MIDAZOLAM HCL 5 MG/5ML IJ SOLN
INTRAMUSCULAR | Status: DC | PRN
  Administered 2012-08-31: 1 mg via INTRAVENOUS

## 2012-08-31 MED ORDER — GLYCOPYRROLATE 0.2 MG/ML IJ SOLN
INTRAMUSCULAR | Status: DC | PRN
  Administered 2012-08-31: 0.1 mg via INTRAVENOUS

## 2012-08-31 MED ORDER — FENTANYL CITRATE 0.05 MG/ML IJ SOLN
25.0000 ug | INTRAMUSCULAR | Status: DC | PRN
  Administered 2012-08-31 (×2): 25 ug via INTRAVENOUS
  Administered 2012-08-31: 50 ug via INTRAVENOUS
  Filled 2012-08-31: qty 1

## 2012-08-31 MED ORDER — DEXTROSE-NACL 5-0.45 % IV SOLN
INTRAVENOUS | Status: DC
  Administered 2012-08-31: via INTRAVENOUS
  Filled 2012-08-31: qty 1000

## 2012-08-31 MED ORDER — EPHEDRINE SULFATE 50 MG/ML IJ SOLN
INTRAMUSCULAR | Status: DC | PRN
  Administered 2012-08-31 (×2): 10 mg via INTRAVENOUS
  Administered 2012-08-31: 5 mg via INTRAVENOUS

## 2012-08-31 MED ORDER — ZOLPIDEM TARTRATE 5 MG PO TABS
5.0000 mg | ORAL_TABLET | Freq: Every evening | ORAL | Status: DC | PRN
  Administered 2012-08-31: 5 mg via ORAL
  Filled 2012-08-31: qty 1

## 2012-08-31 MED ORDER — BELLADONNA ALKALOIDS-OPIUM 16.2-60 MG RE SUPP
1.0000 | Freq: Four times a day (QID) | RECTAL | Status: DC | PRN
  Filled 2012-08-31: qty 1

## 2012-08-31 SURGICAL SUPPLY — 31 items
BAG DRAIN URO-CYSTO SKYTR STRL (DRAIN) ×2 IMPLANT
BAG URINE DRAINAGE (UROLOGICAL SUPPLIES) ×2 IMPLANT
BAG URINE LEG 19OZ MD ST LTX (BAG) ×2 IMPLANT
CANISTER SUCT LVC 12 LTR MEDI- (MISCELLANEOUS) ×4 IMPLANT
CATH FOLEY 2WAY SLVR  5CC 20FR (CATHETERS)
CATH FOLEY 2WAY SLVR  5CC 22FR (CATHETERS)
CATH FOLEY 2WAY SLVR 30CC 22FR (CATHETERS) IMPLANT
CATH FOLEY 2WAY SLVR 5CC 20FR (CATHETERS) IMPLANT
CATH FOLEY 2WAY SLVR 5CC 22FR (CATHETERS) IMPLANT
CATH FOLEY 3WAY 30CC 22F (CATHETERS) ×2 IMPLANT
CATH HEMA 3WAY 30CC 24FR COUDE (CATHETERS) IMPLANT
CATH HEMA 3WAY 30CC 24FR RND (CATHETERS) IMPLANT
CLOTH BEACON ORANGE TIMEOUT ST (SAFETY) ×2 IMPLANT
DRAPE CAMERA CLOSED 9X96 (DRAPES) ×2 IMPLANT
ELECT BUTTON HF 24-28F 2 30DE (ELECTRODE) IMPLANT
ELECT LOOP MED HF 24F 12D CBL (CLIP) ×2 IMPLANT
ELECT REM PT RETURN 9FT ADLT (ELECTROSURGICAL) ×2
ELECTRODE REM PT RTRN 9FT ADLT (ELECTROSURGICAL) ×1 IMPLANT
EVACUATOR MICROVAS BLADDER (UROLOGICAL SUPPLIES) ×2 IMPLANT
GLOVE BIO SURGEON STRL SZ7.5 (GLOVE) ×2 IMPLANT
GLOVE BIO SURGEON STRL SZ8 (GLOVE) ×2 IMPLANT
GLOVE ECLIPSE 6.5 STRL STRAW (GLOVE) ×2 IMPLANT
GOWN PREVENTION PLUS LG XLONG (DISPOSABLE) ×2 IMPLANT
GOWN STRL REIN XL XLG (GOWN DISPOSABLE) ×2 IMPLANT
HOLDER FOLEY CATH W/STRAP (MISCELLANEOUS) ×2 IMPLANT
KIT ASPIRATION TUBING (SET/KITS/TRAYS/PACK) ×2 IMPLANT
PACK CYSTOSCOPY (CUSTOM PROCEDURE TRAY) ×2 IMPLANT
PLUG CATH AND CAP STER (CATHETERS) IMPLANT
SET ASPIRATION TUBING (TUBING) IMPLANT
SYR 30ML LL (SYRINGE) IMPLANT
SYRINGE IRR TOOMEY STRL 70CC (SYRINGE) IMPLANT

## 2012-08-31 NOTE — Transfer of Care (Signed)
Immediate Anesthesia Transfer of Care Note  Patient: Thomas Grant  Procedure(s) Performed: Procedure(s) (LRB): TRANSURETHRAL RESECTION OF THE PROSTATE WITH GYRUS INSTRUMENTS (N/A)  Patient Location: PACU  Anesthesia Type: General  Level of Consciousness: awake, alert  and oriented  Airway & Oxygen Therapy: Patient Spontanous Breathing and Patient connected to face mask oxygen  Post-op Assessment: Report given to PACU RN and Post -op Vital signs reviewed and stable  Post vital signs: Reviewed and stable  Complications: No apparent anesthesia complications

## 2012-08-31 NOTE — Anesthesia Postprocedure Evaluation (Signed)
  Anesthesia Post-op Note  Patient: Thomas Grant  Procedure(s) Performed: Procedure(s) (LRB): TRANSURETHRAL RESECTION OF THE PROSTATE WITH GYRUS INSTRUMENTS (N/A)  Patient Location: PACU  Anesthesia Type: General  Level of Consciousness: awake and alert   Airway and Oxygen Therapy: Patient Spontanous Breathing  Post-op Pain: mild  Post-op Assessment: Post-op Vital signs reviewed, Patient's Cardiovascular Status Stable, Respiratory Function Stable, Patent Airway and No signs of Nausea or vomiting  Last Vitals:  Filed Vitals:   08/31/12 0955  BP: 123/72  Pulse: 70  Temp: 36.3 C  Resp: 10    Post-op Vital Signs: stable   Complications: No apparent anesthesia complications

## 2012-08-31 NOTE — Anesthesia Procedure Notes (Signed)
Procedure Name: LMA Insertion Date/Time: 08/31/2012 8:58 AM Performed by: Norva Pavlov Pre-anesthesia Checklist: Patient identified, Emergency Drugs available, Suction available and Patient being monitored Patient Re-evaluated:Patient Re-evaluated prior to inductionOxygen Delivery Method: Circle System Utilized Preoxygenation: Pre-oxygenation with 100% oxygen Intubation Type: IV induction Ventilation: Mask ventilation without difficulty LMA: LMA inserted LMA Size: 5.0 Number of attempts: 1 Airway Equipment and Method: bite block Placement Confirmation: positive ETCO2 Tube secured with: Tape Dental Injury: Teeth and Oropharynx as per pre-operative assessment

## 2012-08-31 NOTE — H&P (View-Only) (Signed)
Urology History and Physical Exam  CC: Urinary retention  HPI: 62 year old male presents for TUR-P as management for urinary retention. His history is as follows:   ADENOCARCINOMA OF THE PROSTATE  He was diagnosed on biopsy performed on 10/10/2011. He first presented with a PSA of 8.29 in May, 2013. Prostate exam was basically normal, prostatic size was 75.8 cc. 11/12 biopsies returned adenocarcinoma. 10 revealed Gleason 3+3 pattern within the cores, one revealed Gleason 4+3 pattern. That was in the left base lateral. CT of the abdomen and pelvis was negative for metastatic disease except for a 1.4 cm adrenal nodule which is most likely an adrenal adenoma. Bone scan was negative.  He was referred for radiotherapy, and saw Dr. Matthew Manning. It was recommended that he undergo combination radiotherapy, which was completed on 03/19/2012. He received one Trelstar injection (6 months) on 11/27/2011.  URINARY RETENTION;  Thomas Grant has had a difficult time with urination, and has been self catheterizing for several months following his radiation. His catheterized volume is now less than 600 cc. He catheterizes about 3 times a day. He does not have gross hematuria. He does have a spontaneous draining occasionally, he only voids for perhaps 6-10 seconds with each void. He does not have leakage. Recent Urodynamics revealed a bladder capacity of about 600 cc with fairly normal sensations, as well as sustained contractions but no flow. Cystoscopy revealed prostatic obstruction. Because of persistent retention he presents for TUR-P for management of prostatic obstruction.     PMH: Past Medical History  Diagnosis Date  . Hypercholesterolemia   . Atherosclerosis   . HTN (hypertension)     not actively treated  . History of radiation therapy 01/21/2012- 11/25    Prostate 45Gy/25fxs,of 1.8Gy  . BPH (benign prostatic hypertrophy) with urinary retention   . Prostate cancer 10/10/11 bx---  s/p radioactive  seed implants 03-19-2012    Adenocarcinoma,gleason=3+3=6,& 4+3=7,volume=75.8cc,Psa=8.29  . Self-catheterizes urinary bladder     3 to 4 times daily    PSH: Past Surgical History  Procedure Laterality Date  . Prostate biopsy  10/10/11    Adenocarcinoma,gleason= 3+3=6,& 4+3=7,volume=75.8cc,Psa=8.29  . Radioactive seed implant  03/19/2012    Procedure: RADIOACTIVE SEED IMPLANT;  Surgeon: Tukker Byrns, MD;  Location: [facility];  Service: Urology;  Laterality: N/A;  70  seeds implanted  . Cystoscopy  03/19/2012    Procedure: CYSTOSCOPY FLEXIBLE;  Surgeon: Terrionna Bridwell, MD;  Location: [facility];  Service: Urology;  Laterality: N/A;  no seeds found in bladder    Allergies: Not on File  Medications:  (Not in a hospital admission)   Social History: History   Social History  . Marital Status: Married    Spouse Name: Thomas Grant    Number of Children: 0  . Years of Education: N/A   Occupational History  . Mortgage Analyst [employer]   Social History Main Topics  . Smoking status: Current Every Day Smoker -- 0.80 packs/day for .5 years    Types: Cigarettes  . Smokeless tobacco: Never Used  . Alcohol Use: 0.0 oz/week     Comment: occasionally  . Drug Use: No  . Sexually Active: Not on file   Other Topics Concern  . Not on file   Social History Narrative  . No narrative on file    Family History: Family History  Problem Relation Age of Onset  . Prostate cancer Brother 54    seed implant  . Diabetes type   II Mother     Lupus alive at age 82  . Alzheimer's disease Father     deceased age 76    Review of Systems: Genitourinary, constitutional, skin, eye, otolaryngeal, hematologic/lymphatic, cardiovascular, pulmonary, endocrine, musculoskeletal, gastrointestinal, neurological and psychiatric system(s) were reviewed and pertinent findings if present are noted.  Genitourinary: incomplete emptying of bladder and erectile  dysfunction.      Physical Exam: There were no vitals filed for this visit. General: No acute distress.  Awake. Head:  Normocephalic.  Atraumatic. ENT:  EOMI.  Mucous membranes moist Neck:  Supple.  No lymphadenopathy. CV:  S1 present. S2 present. Regular rate. Pulmonary: Equal effort bilaterally.  Clear to auscultation bilaterally. Abdomen: Soft.  Non tender to palpation. Skin:  Normal turgor.  No visible rash. Extremity: No gross deformity of bilateral upper extremities.  No gross deformity of bilateral lower extremities. Neurologic: Alert. Appropriate mood.   Studies:  No results found for this basename: HGB, WBC, PLT,  in the last 72 hours  No results found for this basename: NA, K, CL, CO2, BUN, CREATININE, CALCIUM, MAGNESIUM, GFRNONAA, GFRAA,  in the last 72 hours   No results found for this basename: PT, INR, APTT,  in the last 72 hours   No components found with this basename: ABG,     Assessment:      1. Adenocarcinoma prostate. This was diagnosed with 10/10/2011. Initial PSA was 8.3, glandular size is 76 cc, 11/12 biopsies were positive, 10 for Gleason 3+3 pattern, one for Gleason 3+4 pattern. He completed combination radiotherapy on 03/19/2012. His first Trel star dose was on 11/27/2011. He will be given one whole year of androgen deprivation.  2. BPH/retention. Prostatic volume at the time of his ultrasound and biopsy was 76 cc. This is probably multifactorial, but seems to have been set off by his radiation. He is on self intermittent catheterization. He does have significant obstruction cystoscopically with the median lobe   Plan: TUR-P. He is aware of the procedure as well as riks of persistent retention, leakage, bleeding, infection as well as other complications. He desires to proceed.   

## 2012-08-31 NOTE — Interval H&P Note (Signed)
History and Physical Interval Note:  08/31/2012 8:45 AM  Thomas Grant  has presented today for surgery, with the diagnosis of BENIGN PROSTATIC HYPERTROPHY, RETENTION  The various methods of treatment have been discussed with the patient and family. After consideration of risks, benefits and other options for treatment, the patient has consented to  Procedure(s): TRANSURETHRAL RESECTION OF THE PROSTATE WITH GYRUS INSTRUMENTS (N/A) as a surgical intervention .  The patient's history has been reviewed, patient examined, no change in status, stable for surgery.  I have reviewed the patient's chart and labs.  Questions were answered to the patient's satisfaction.     Chelsea Aus

## 2012-08-31 NOTE — Progress Notes (Signed)
Pt transferred to RCC 2 via stretcher w foley / CBI irrigation, SCD's & IV / without complications. Family in to see pt & report given to Cato Mulligan @ 1155.

## 2012-08-31 NOTE — Op Note (Signed)
Preoperative diagnosis: 1. Bladder outlet obstruction secondary to BPH 2.   Adenocarcinoma of the prostate, s/p combination radiotherapy Postoperative diagnosis:  1. Bladder outlet obstruction secondary to BPH       2.   Adenocarcinoma of the prostate, s/p combination radiotherapy Procedure:  1. Cystoscopy 2. Transurethral resection of the prostate  Surgeon: Bertram Millard. Tylene Quashie, M.D.  Anesthesia: General  Complications: None  Drain: Foley catheter  EBL: Minimal  Specimens: 1. Prostate chips  Disposition of specimens: Pathology  Indication: Thomas Grant is a patient with bladder outlet obstruction secondary to benign prostatic hyperplasia. He is about 6 months out from combination radiotherapy for adenocarcinoma of the prostate. Unfortunately, he has been in retention since that time. He has been were lined on self catheterization, which he has tolerated quite well. Recent urodynamics and cystoscopy revealed evidence of obstruction due to median lobe of the prostate, with a fairly normally functioning bladder, with normal pressures but no ability to empty. After reviewing the management options for treatment, he elected to proceed with the above surgical procedure(s). We have discussed the potential benefits and risks of the procedure, side effects of the proposed treatment, the likelihood of the patient achieving the goals of the procedure, and any potential problems that might occur during the procedure or recuperation. Informed consent has been obtained.  Description of procedure:  The patient was identified in the holding area.He received preoperative antibiotics. He was then taken to the operating room. General anesthetic was administered.  The patient was then placed in the dorsal lithotomy position, prepped and draped in the usual sterile fashion. Timeout was then performed.  A resectoscope sheath was placed using the obturator, and the resectoscope, loop and telescope  were placed.  The bladder was then systematically examined in its entirety. There was no evidence of  tumors, stones, or other mucosal pathology. There was moderate trabeculation of the bladder. There was obvious obstruction from an intravesical median lobe.  The ureteral orifices were identified and marked so as to be avoided during the procedure.  The prostate adenoma was then resected utilizing loop cautery resection with the bipolar cutting loop.  Initially, the median lobe was resected, using great care to avoid injury to the ureteral orifices. The prostate adenoma from the bladder neck back to the verumontanum was resected beginning at the six o'clock position and then extended to include the right and left lobes of the prostate and anterior prostate, respectively. Care was taken not to resect distal to the verumontanum. There was not a significant amount of obstructing lateral lobe tissue.  Hemostasis was then achieved with the cautery and the bladder was emptied and reinspected with no significant bleeding noted at the end of the procedure.  Prostatic chips, as well as a few iodine seeds and spacers were irrigated from the bladder, and were sent for permanent pathology.  A 3 way catheter was then placed into the bladder and placed on continuous bladder irrigation.  The patient appeared to tolerate the procedure well and without complications.  The patient was able to be awakened and transferred to the recovery unit in satisfactory condition.

## 2012-08-31 NOTE — H&P (Signed)
Urology History and Physical Exam  CC: Urinary retention  HPI: 62 year old male presents for TUR-P as management for urinary retention. His history is as follows:   ADENOCARCINOMA OF THE PROSTATE  He was diagnosed on biopsy performed on 10/10/2011. He first presented with a PSA of 8.29 in May, 2013. Prostate exam was basically normal, prostatic size was 75.8 cc. 11/12 biopsies returned adenocarcinoma. 10 revealed Gleason 3+3 pattern within the cores, one revealed Gleason 4+3 pattern. That was in the left base lateral. CT of the abdomen and pelvis was negative for metastatic disease except for a 1.4 cm adrenal nodule which is most likely an adrenal adenoma. Bone scan was negative.  He was referred for radiotherapy, and saw Dr. Margaretmary Dys. It was recommended that he undergo combination radiotherapy, which was completed on 03/19/2012. He received one Trelstar injection (6 months) on 11/27/2011.  URINARY RETENTION;  Mr. Angell has had a difficult time with urination, and has been self catheterizing for several months following his radiation. His catheterized volume is now less than 600 cc. He catheterizes about 3 times a day. He does not have gross hematuria. He does have a spontaneous draining occasionally, he only voids for perhaps 6-10 seconds with each void. He does not have leakage. Recent Urodynamics revealed a bladder capacity of about 600 cc with fairly normal sensations, as well as sustained contractions but no flow. Cystoscopy revealed prostatic obstruction. Because of persistent retention he presents for TUR-P for management of prostatic obstruction.     PMH: Past Medical History  Diagnosis Date  . Hypercholesterolemia   . Atherosclerosis   . HTN (hypertension)     not actively treated  . History of radiation therapy 01/21/2012- 11/25    Prostate 45Gy/70fxs,of 1.8Gy  . BPH (benign prostatic hypertrophy) with urinary retention   . Prostate cancer 10/10/11 bx---  s/p radioactive  seed implants 03-19-2012    Adenocarcinoma,gleason=3+3=6,& 4+3=7,volume=75.8cc,Psa=8.29  . Self-catheterizes urinary bladder     3 to 4 times daily    PSH: Past Surgical History  Procedure Laterality Date  . Prostate biopsy  10/10/11    Adenocarcinoma,gleason= 3+3=6,& 4+3=7,volume=75.8cc,Psa=8.29  . Radioactive seed implant  03/19/2012    Procedure: RADIOACTIVE SEED IMPLANT;  Surgeon: Marcine Matar, MD;  Location: [facility];  Service: Urology;  Laterality: N/A;  70  seeds implanted  . Cystoscopy  03/19/2012    Procedure: CYSTOSCOPY FLEXIBLE;  Surgeon: Marcine Matar, MD;  Location: [facility];  Service: Urology;  Laterality: N/A;  no seeds found in bladder    Allergies: Not on File  Medications:  (Not in a hospital admission)   Social History: History   Social History  . Marital Status: Married    Spouse Name: Gaylyn Lambert    Number of Children: 0  . Years of Education: N/A   Occupational History  . 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 Smoking status: Current Every Day Smoker -- 0.80 packs/day for .5 years    Types: Cigarettes  . Smokeless tobacco: Never Used  . Alcohol Use: 0.0 oz/week     Comment: occasionally  . Drug Use: No  . Sexually Active: Not on file   Other Topics Concern  . Not on file   Social History Narrative  . No narrative on file    Family History: Family History  Problem Relation Age of Onset  . Prostate cancer Brother 54    seed implant  . Diabetes type  II Mother     Lupus alive at age 4  . Alzheimer's disease Father     deceased age 54    Review of Systems: Genitourinary, constitutional, skin, eye, otolaryngeal, hematologic/lymphatic, cardiovascular, pulmonary, endocrine, musculoskeletal, gastrointestinal, neurological and psychiatric system(s) were reviewed and pertinent findings if present are noted.  Genitourinary: incomplete emptying of bladder and erectile  dysfunction.      Physical Exam: There were no vitals filed for this visit. General: No acute distress.  Awake. Head:  Normocephalic.  Atraumatic. ENT:  EOMI.  Mucous membranes moist Neck:  Supple.  No lymphadenopathy. CV:  S1 present. S2 present. Regular rate. Pulmonary: Equal effort bilaterally.  Clear to auscultation bilaterally. Abdomen: Soft.  Non tender to palpation. Skin:  Normal turgor.  No visible rash. Extremity: No gross deformity of bilateral upper extremities.  No gross deformity of bilateral lower extremities. Neurologic: Alert. Appropriate mood.   Studies:  No results found for this basename: HGB, WBC, PLT,  in the last 72 hours  No results found for this basename: NA, K, CL, CO2, BUN, CREATININE, CALCIUM, MAGNESIUM, GFRNONAA, GFRAA,  in the last 72 hours   No results found for this basename: PT, INR, APTT,  in the last 72 hours   No components found with this basename: ABG,     Assessment:      1. Adenocarcinoma prostate. This was diagnosed with 10/10/2011. Initial PSA was 8.3, glandular size is 76 cc, 11/12 biopsies were positive, 10 for Gleason 3+3 pattern, one for Gleason 3+4 pattern. He completed combination radiotherapy on 03/19/2012. His first Trel star dose was on 11/27/2011. He will be given one whole year of androgen deprivation.  2. BPH/retention. Prostatic volume at the time of his ultrasound and biopsy was 76 cc. This is probably multifactorial, but seems to have been set off by his radiation. He is on self intermittent catheterization. He does have significant obstruction cystoscopically with the median lobe   Plan: TUR-P. He is aware of the procedure as well as riks of persistent retention, leakage, bleeding, infection as well as other complications. He desires to proceed.

## 2012-08-31 NOTE — Anesthesia Preprocedure Evaluation (Signed)
Anesthesia Evaluation  Patient identified by MRN, date of birth, ID band Patient awake    Reviewed: Allergy & Precautions, H&P , NPO status , Patient's Chart, lab work & pertinent test results  Airway Mallampati: II TM Distance: >3 FB Neck ROM: Full    Dental no notable dental hx.    Pulmonary Current Smoker,  breath sounds clear to auscultation  Pulmonary exam normal       Cardiovascular hypertension, negative cardio ROS  Rhythm:Regular Rate:Normal     Neuro/Psych negative neurological ROS  negative psych ROS   GI/Hepatic negative GI ROS, Neg liver ROS,   Endo/Other  negative endocrine ROS  Renal/GU negative Renal ROS  negative genitourinary   Musculoskeletal negative musculoskeletal ROS (+)   Abdominal   Peds negative pediatric ROS (+)  Hematology negative hematology ROS (+)   Anesthesia Other Findings   Reproductive/Obstetrics negative OB ROS                           Anesthesia Physical Anesthesia Plan  ASA: II  Anesthesia Plan: General   Post-op Pain Management:    Induction: Intravenous  Airway Management Planned: LMA  Additional Equipment:   Intra-op Plan:   Post-operative Plan: Extubation in OR  Informed Consent: I have reviewed the patients History and Physical, chart, labs and discussed the procedure including the risks, benefits and alternatives for the proposed anesthesia with the patient or authorized representative who has indicated his/her understanding and acceptance.   Dental advisory given  Plan Discussed with: CRNA  Anesthesia Plan Comments:         Anesthesia Quick Evaluation

## 2012-09-01 ENCOUNTER — Encounter (HOSPITAL_BASED_OUTPATIENT_CLINIC_OR_DEPARTMENT_OTHER): Payer: Self-pay | Admitting: Urology

## 2012-09-01 MED ORDER — CIPROFLOXACIN HCL 250 MG PO TABS: 250.0000 mg | ORAL_TABLET | Freq: Two times a day (BID) | ORAL | Status: DC

## 2012-09-01 NOTE — Discharge Summary (Signed)
  [name] MRN: [id number] DOB/AGE: January 27, 1951 62 y.o.  Admit date: 08/31/2012 Discharge date: 09/01/2012  Primary Care Physician:  Juline Patch, MD  Discharge Diagnoses:   Present on Admission:  **None**  Consults:  None    Discharge Medications:   Medication List    TAKE these medications       ciprofloxacin 250 MG tablet  Commonly known as:  CIPRO  Take 1 tablet (250 mg total) by mouth 2 (two) times daily.     fish oil-omega-3 fatty acids 1000 MG capsule  Take 1 g by mouth daily.     ibuprofen 200 MG tablet  Commonly known as:  ADVIL,MOTRIN  Take 200 mg by mouth every 6 (six) hours as needed. pain     multivitamin tablet  Take 1 tablet by mouth daily.     VITAMIN B 12 PO  Take 1,000 mcg by mouth daily.     vitamin C 500 MG tablet  Commonly known as:  ASCORBIC ACID  Take 500 mg by mouth daily.     Zinc 100 MG Tabs  Take 100 mg by mouth daily.         Significant Diagnostic Studies:  No results found.  Brief H and P: For complete details please refer to admission H and [facility] Course:   Patient underwent a TURP by Dr. Patsi Sears. He was kept 23 hours for observation. Urine remained light pink in color. Foley catheter was removed on postoperative day 1 and the patient was able to void on several occasions with relatively good stream and except hematuria. He's had no obvious complications or problems. Day of Discharge BP 133/78  Pulse 55  Temp(Src) 97.5 F (36.4 C) (Oral)  Resp 20  Ht 5\' 11"  (1.803 m)  Wt 69.174 kg (152 lb 8 oz)  BMI 21.28 kg/m2  SpO2 98%  No results found for this or any previous visit (from the past 24 hour(s)).

## 2012-09-01 NOTE — Progress Notes (Signed)
Dr. Isabel Caprice called by Jeffory Snelgrove rn ,bsn  Because there was no order for pain medication and the patient wanted to make sure he did not need one.  Patient stated he was fine and did not feel that he needed anything for pain.Dr. Isabel Caprice was ok with that decision by the patient.

## 2012-09-01 NOTE — Progress Notes (Signed)
Dr.Grapey in to visit patient and ok'd him going.

## 2013-02-04 ENCOUNTER — Other Ambulatory Visit: Payer: Self-pay

## 2013-02-11 ENCOUNTER — Other Ambulatory Visit: Payer: Self-pay | Admitting: Gastroenterology

## 2014-02-17 ENCOUNTER — Other Ambulatory Visit: Payer: Self-pay | Admitting: Gastroenterology

## 2014-12-20 ENCOUNTER — Encounter: Payer: Self-pay | Admitting: Cardiology

## 2014-12-20 DIAGNOSIS — I429 Cardiomyopathy, unspecified: Secondary | ICD-10-CM

## 2014-12-20 DIAGNOSIS — I447 Left bundle-branch block, unspecified: Secondary | ICD-10-CM

## 2014-12-20 DIAGNOSIS — I428 Other cardiomyopathies: Secondary | ICD-10-CM

## 2014-12-20 NOTE — H&P (Signed)
Thomas Grant    Date of visit:  12/20/2014 DOB:  1950/09/01    Age:  64 yrs. Medical record number:  [id number]     Account number:  [id number] Primary Care Ataya Murdy: Deland Pretty ____________________________ CURRENT DIAGNOSES  1. Cardiomyopathy, unspecified  2. Encounter for preprocedural cardiovascular examination  3. Left bundle-branch block  4. Hyperlipidemia ____________________________ ALLERGIES  No Known Allergies ____________________________ MEDICATIONS  1. Vitamin C 1,000 mg tablet, 1 p.o. daily  2. zinc 50 mg tablet, 1 p.o. daily  3. Crestor 10 mg tablet, 1 p.o. daily  4. amitriptyline 10 mg tablet, QHS  5. tramadol 50 mg tablet, PRN  6. aspirin 81 mg chewable tablet, 1 p.o. daily  7. vitamin E 400 unit capsule, 1 p.o. daily  8. lisinopril 5 mg tablet, 1 p.o. daily  9. carvedilol 3.125 mg tablet, BID  10. carvedilol 6.25 mg tablet, BID ____________________________ CHIEF COMPLAINTS  Followup of Cardiomyopathy, unspecified ____________________________ HISTORY OF PRESENT ILLNESS Patient seen for cardiac followup. He continues to have some fatigue that was worse after he started the beta blockers but improved. He is not really having any shortness of breath or chest pain. He has a known cardiomyopathy with a bundle-branch block pattern with an EF of 40%. He denies PND, orthopnea or significant edema. Does have fatigue in the mornings that he relates to previous treatment for his prostate cancer. ____________________________ PAST HISTORY  Past Medical Illnesses:  hyperlipidemia, prostate cancer treated with radiation therapy, seed implant,and hormonal therapy, denies hypertension or diabetes;  Cardiovascular Illnesses:  conduction disorder-LBBB;  Surgical Procedures:  TURP;  NYHA Classification:  I;  Cardiology Procedures-Invasive:  no previous interventional or invasive cardiology procedures;  Cardiology Procedures-Noninvasive:  echocardiogram;  LVEF of 40% documented via  echocardiogram on 09/20/2014,   ____________________________ CARDIO-PULMONARY TEST DATESEchocardiography Date: 09/20/2014;   ____________________________ FAMILY HISTORY Brother -- Brother alive and well Father -- Father dead, Dementia/Alzheimers Mother -- Mother alive with problem, Diabetes mellitus, Coronary artery bypass grafting, Coronary Artery Disease Sister -- Sister alive and well ____________________________ SOCIAL HISTORY Alcohol Use:  beer 1-2 per day;  Smoking:  used to smoke but quit 2013, 25 pack year history;  Lifestyle:  divorced and remarried;  Exercise:  occasional;  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  Integumentary:no rashes or new skin lesions. Eyes: wears eye glasses/contact lenses, denies diplopia, glaucoma or visual field defects. Ears, Nose, Throat, Mouth:  denies any hearing loss, epistaxis, hoarseness or difficulty speaking. Respiratory: denies dyspnea, cough, wheezing or hemoptysis. Cardiovascular:  please review HPI Abdominal: denies dyspepsia, GI bleeding, constipation, or diarrhea Genitourinary-Male: dysuria, urgency  Musculoskeletal:  denies arthritis, venous insufficiency, or muscle weakness Neurological:  denies headaches, stroke, or TIA Psychiatric:  denies depression or anxiety Hematological/Immunologic:  denies any food allergies, bleeding disorders. ____________________________ PHYSICAL EXAMINATION VITAL SIGNS  Blood Pressure:  124/60 Sitting, Left arm, regular cuff  , 118/66 Standing, Left arm and regular cuff   Pulse:  64/min. Weight:  152.00 lbs. Height:  71"BMI: 21  Constitutional:  pleasant white male in no acute distress Skin:  warm and dry to touch, no apparent skin lesions, or masses noted. Head:  normocephalic, normal hair pattern, no masses or tenderness Eyes:  EOMS  Intact, PERRLA, C and S clear, Funduscopic exam not done. ENT:  ears, nose and throat reveal no gross abnormalities.  Dentition good.  Neck:  supple, without massess. No JVD, thyromegaly or carotid bruits. Carotid upstroke normal. Chest:  normal symmetry, clear to auscultation. Cardiac:  regular rhythm, normal S1 and S2, No S3 or S4, no murmurs, gallops or rubs detected. Abdomen:  abdomen soft,non-tender, no masses, no hepatospenomegaly, or aneurysm noted Peripheral Pulses:  the femoral,dorsalis pedis, and posterior tibial pulses are full and equal bilaterally with no bruits auscultated. Extremities & Back:  no deformities, clubbing, cyanosis, erythema or edema observed. Normal muscle strength and tone. Neurological:  no gross motor or sensory deficits noted, affect appropriate, oriented x3. ____________________________ MOST RECENT LIPID PANEL 08/03/14  CHOL TOTL 214 mg/dl, LDL 125 NM, HDL 74 mg/dl, TRIGLYCER 75 mg/dl, ALT 59 u/l, ALK PHOS 112 u/l, CHOL/HDL 2.9 (Calc) and AST 41 u/l ____________________________ IMPRESSIONS/PLAN  1. Cardiomyopathy-type undetermined 2. Left pleural branch block 3. Hyperlipidemia  Recommendations:  Increase carvedilol to 6.25 mg twice a day. Continue to titrate other medications. Plan catheterization to assess for coronary artery disease.Cardiac catheterization was discussed with the patient including risks of myocardial infarction, death, stroke, bleeding, arrhythmia, dye allergy, or renal insufficiency. He understands and is willing to proceed. Possibility of percutaneous intervention at the same setting was also discussed with the patient including risks. Plan October 11. Lab work done within the week prior to that. ____________________________ Cleda Clarks  1. Comprehensive Metabolic Panel: Today  2. Complete Blood Count: Today  3. Draw PT/INR: Today  4. PTT: Today                       ____________________________ Cardiology Physician:  Kerry Hough MD [facility]

## 2015-01-09 ENCOUNTER — Other Ambulatory Visit: Payer: Self-pay | Admitting: Cardiology

## 2015-01-10 ENCOUNTER — Encounter (HOSPITAL_COMMUNITY)
Admission: RE | Disposition: A | Discharge status: Home / Self Care | Payer: Self-pay | Source: Ambulatory Visit | Attending: Cardiology

## 2015-01-10 ENCOUNTER — Ambulatory Visit (HOSPITAL_COMMUNITY)
Admission: RE | Admit: 2015-01-10 | Discharge: 2015-01-10 | Disposition: A | Discharge status: Home / Self Care | Payer: BLUE CROSS/BLUE SHIELD | Source: Ambulatory Visit | Attending: Cardiology | Admitting: Cardiology

## 2015-01-10 DIAGNOSIS — I2510 Atherosclerotic heart disease of native coronary artery without angina pectoris: Secondary | ICD-10-CM | POA: Diagnosis not present

## 2015-01-10 DIAGNOSIS — I447 Left bundle-branch block, unspecified: Secondary | ICD-10-CM

## 2015-01-10 DIAGNOSIS — Z87891 Personal history of nicotine dependence: Secondary | ICD-10-CM | POA: Insufficient documentation

## 2015-01-10 DIAGNOSIS — E785 Hyperlipidemia, unspecified: Secondary | ICD-10-CM | POA: Insufficient documentation

## 2015-01-10 DIAGNOSIS — Z8546 Personal history of malignant neoplasm of prostate: Secondary | ICD-10-CM | POA: Insufficient documentation

## 2015-01-10 DIAGNOSIS — Z7982 Long term (current) use of aspirin: Secondary | ICD-10-CM | POA: Insufficient documentation

## 2015-01-10 DIAGNOSIS — Z923 Personal history of irradiation: Secondary | ICD-10-CM | POA: Diagnosis not present

## 2015-01-10 DIAGNOSIS — I429 Cardiomyopathy, unspecified: Secondary | ICD-10-CM | POA: Diagnosis not present

## 2015-01-10 DIAGNOSIS — I428 Other cardiomyopathies: Secondary | ICD-10-CM

## 2015-01-10 HISTORY — PX: CARDIAC CATHETERIZATION: SHX172

## 2015-01-10 SURGERY — LEFT HEART CATH AND CORONARY ANGIOGRAPHY

## 2015-01-10 MED ORDER — MIDAZOLAM HCL 2 MG/2ML IJ SOLN
INTRAMUSCULAR | Status: DC | PRN
  Administered 2015-01-10: 1 mg via INTRAVENOUS

## 2015-01-10 MED ORDER — ONDANSETRON HCL 4 MG/2ML IJ SOLN: 4.0000 mg | Freq: Four times a day (QID) | INTRAMUSCULAR | Status: DC | PRN

## 2015-01-10 MED ORDER — SODIUM CHLORIDE 0.9 % IJ SOLN: 3.0000 mL | INTRAMUSCULAR | Status: DC | PRN

## 2015-01-10 MED ORDER — IOHEXOL 350 MG/ML SOLN
INTRAVENOUS | Status: DC | PRN
  Administered 2015-01-10: 60 mL via INTRA_ARTERIAL

## 2015-01-10 MED ORDER — HEPARIN (PORCINE) IN NACL 2-0.9 UNIT/ML-% IJ SOLN
INTRAMUSCULAR | Status: DC | PRN
  Administered 2015-01-10: 08:00:00

## 2015-01-10 MED ORDER — SODIUM CHLORIDE 0.9 % WEIGHT BASED INFUSION: 1.0000 mL/kg/h | INTRAVENOUS | Status: DC

## 2015-01-10 MED ORDER — LIDOCAINE HCL (PF) 1 % IJ SOLN
INTRAMUSCULAR | Status: DC | PRN
  Administered 2015-01-10: 20 mL

## 2015-01-10 MED ORDER — ACETAMINOPHEN 325 MG PO TABS: 650.0000 mg | ORAL_TABLET | ORAL | Status: DC | PRN

## 2015-01-10 MED ORDER — SODIUM CHLORIDE 0.9 % IJ SOLN: 3.0000 mL | Freq: Two times a day (BID) | INTRAMUSCULAR | Status: DC

## 2015-01-10 MED ORDER — SODIUM CHLORIDE 0.9 % IV SOLN: 250.0000 mL | INTRAVENOUS | Status: DC | PRN

## 2015-01-10 MED ORDER — NITROGLYCERIN 1 MG/10 ML FOR IR/CATH LAB
INTRA_ARTERIAL | Status: AC
  Filled 2015-01-10: qty 10

## 2015-01-10 MED ORDER — ASPIRIN 81 MG PO CHEW
81.0000 mg | CHEWABLE_TABLET | ORAL | Status: AC
  Administered 2015-01-10: 81 mg via ORAL

## 2015-01-10 MED ORDER — LIDOCAINE HCL (PF) 1 % IJ SOLN
INTRAMUSCULAR | Status: AC
  Filled 2015-01-10: qty 30

## 2015-01-10 MED ORDER — SODIUM CHLORIDE 0.9 % WEIGHT BASED INFUSION
3.0000 mL/kg/h | INTRAVENOUS | Status: DC
  Administered 2015-01-10: 3 mL/kg/h via INTRAVENOUS

## 2015-01-10 MED ORDER — MIDAZOLAM HCL 2 MG/2ML IJ SOLN
INTRAMUSCULAR | Status: AC
  Filled 2015-01-10: qty 4

## 2015-01-10 MED ORDER — HEPARIN (PORCINE) IN NACL 2-0.9 UNIT/ML-% IJ SOLN
INTRAMUSCULAR | Status: AC
  Filled 2015-01-10: qty 1000

## 2015-01-10 MED ORDER — SODIUM CHLORIDE 0.9 % IV SOLN: INTRAVENOUS | Status: AC

## 2015-01-10 MED ORDER — ASPIRIN 81 MG PO CHEW
CHEWABLE_TABLET | ORAL | Status: AC
  Filled 2015-01-10: qty 1

## 2015-01-10 SURGICAL SUPPLY — 7 items
CATH INFINITI 5FR MULTPACK ANG (CATHETERS) ×3 IMPLANT
KIT HEART LEFT (KITS) ×3 IMPLANT
PACK CARDIAC CATHETERIZATION (CUSTOM PROCEDURE TRAY) ×3 IMPLANT
SHEATH PINNACLE 5F 10CM (SHEATH) ×3 IMPLANT
SYR MEDRAD MARK V 150ML (SYRINGE) ×3 IMPLANT
TRANSDUCER W/STOPCOCK (MISCELLANEOUS) ×3 IMPLANT
WIRE EMERALD 3MM-J .035X150CM (WIRE) ×3 IMPLANT

## 2015-01-10 NOTE — Interval H&P Note (Signed)
History and Physical Interval Note:  01/10/2015 7:42 AM  Thomas Grant  has presented today for surgery, with the diagnosis of cp  The various methods of treatment have been discussed with the patient and family. After consideration of risks, benefits and other options for treatment, the patient has consented to  Procedure(s): Left Heart Cath and Coronary Angiography (N/A) as a surgical intervention .  The patient's history has been reviewed, patient examined, no change in status, stable for surgery.  I have reviewed the patient's chart and labs.  Questions were answered to the patient's satisfaction.     Ezzard Standing

## 2015-01-10 NOTE — Progress Notes (Addendum)
Site area: RFA Site Prior to Removal:  Level 0 Pressure Applied For:45min Manual:  yes  Patient Status During Pull:  stable Post Pull Site:  Level 0 Post Pull Instructions Given: yes  Post Pull Pulses Present: palpable Dressing Applied:  clear Bedrest begins @ 0900 till 1300 Comments:removed by Jonita Albee

## 2015-01-10 NOTE — Discharge Instructions (Signed)
Angiogram, Care After °Refer to this sheet in the next few weeks. These instructions provide you with information about caring for yourself after your procedure. Your health care provider may also give you more specific instructions. Your treatment has been planned according to current medical practices, but problems sometimes occur. Call your health care provider if you have any problems or questions after your procedure. °WHAT TO EXPECT AFTER THE PROCEDURE °After your procedure, it is typical to have the following: °· Bruising at the catheter insertion site that usually fades within 1-2 weeks. °· Blood collecting in the tissue (hematoma) that may be painful to the touch. It should usually decrease in size and tenderness within 1-2 weeks. °HOME CARE INSTRUCTIONS °· Take medicines only as directed by your health care provider. °· You may shower 24-48 hours after the procedure or as directed by your health care provider. Remove the bandage (dressing) and gently wash the site with plain soap and water. Pat the area dry with a clean towel. Do not rub the site, because this may cause bleeding. °· Do not take baths, swim, or use a hot tub until your health care provider approves. °· Check your insertion site every day for redness, swelling, or drainage. °· Do not apply powder or lotion to the site. °· Do not lift over 10 lb (4.5 kg) for 5 days after your procedure or as directed by your health care provider. °· Ask your health care provider when it is okay to: °¨ Return to work or school. °¨ Resume usual physical activities or sports. °¨ Resume sexual activity. °· Do not drive home if you are discharged the same day as the procedure. Have someone else drive you. °· You may drive 24 hours after the procedure unless otherwise instructed by your health care provider. °· Do not operate machinery or power tools for 24 hours after the procedure or as directed by your health care provider. °· If your procedure was done as an  outpatient procedure, which means that you went home the same day as your procedure, a responsible adult should be with you for the first 24 hours after you arrive home. °· Keep all follow-up visits as directed by your health care provider. This is important. °SEEK MEDICAL CARE IF: °· You have a fever. °· You have chills. °· You have increased bleeding from the catheter insertion site. Hold pressure on the site. °SEEK IMMEDIATE MEDICAL CARE IF: °· You have unusual pain at the catheter insertion site. °· You have redness, warmth, or swelling at the catheter insertion site. °· You have drainage (other than a small amount of blood on the dressing) from the catheter insertion site. °· The catheter insertion site is bleeding, and the bleeding does not stop after 30 minutes of holding steady pressure on the site. °· The area near or just beyond the catheter insertion site becomes pale, cool, tingly, or numb. °  °This information is not intended to replace advice given to you by your health care provider. Make sure you discuss any questions you have with your health care provider. °  °Document Released: 10/04/2004 Document Revised: 04/08/2014 Document Reviewed: 08/19/2012 °Elsevier Interactive Patient Education ©2016 Elsevier Inc. ° °

## 2015-01-11 ENCOUNTER — Encounter (HOSPITAL_COMMUNITY): Payer: Self-pay | Admitting: Cardiology

## 2015-08-17 ENCOUNTER — Other Ambulatory Visit: Payer: Self-pay | Admitting: Internal Medicine

## 2015-08-17 DIAGNOSIS — I739 Peripheral vascular disease, unspecified: Secondary | ICD-10-CM

## 2015-08-29 ENCOUNTER — Ambulatory Visit
Admission: RE | Admit: 2015-08-29 | Discharge: 2015-08-29 | Disposition: A | Discharge status: Home / Self Care | Payer: BLUE CROSS/BLUE SHIELD | Source: Ambulatory Visit | Attending: Internal Medicine | Admitting: Internal Medicine

## 2015-08-29 DIAGNOSIS — I739 Peripheral vascular disease, unspecified: Secondary | ICD-10-CM

## 2015-09-28 ENCOUNTER — Other Ambulatory Visit: Payer: Self-pay | Admitting: Gastroenterology

## 2016-03-19 ENCOUNTER — Telehealth: Payer: Self-pay | Admitting: Hematology and Oncology

## 2016-03-19 ENCOUNTER — Encounter: Payer: Self-pay | Admitting: Hematology and Oncology

## 2016-03-19 NOTE — Telephone Encounter (Signed)
Appt scheduled w/Gudena on 04/24/16 at 1pm. Pt aware to arrive 30 minutes early. Demographics verified. Letter mailed to the pt.

## 2016-04-24 ENCOUNTER — Ambulatory Visit (HOSPITAL_BASED_OUTPATIENT_CLINIC_OR_DEPARTMENT_OTHER): Payer: BLUE CROSS/BLUE SHIELD

## 2016-04-24 ENCOUNTER — Encounter: Payer: Self-pay | Admitting: Hematology and Oncology

## 2016-04-24 ENCOUNTER — Ambulatory Visit (HOSPITAL_BASED_OUTPATIENT_CLINIC_OR_DEPARTMENT_OTHER): Payer: BLUE CROSS/BLUE SHIELD | Admitting: Hematology and Oncology

## 2016-04-24 VITALS — BP 155/65 | HR 59 | Temp 97.8°F | Resp 18 | Wt 166.8 lb

## 2016-04-24 DIAGNOSIS — D509 Iron deficiency anemia, unspecified: Secondary | ICD-10-CM | POA: Insufficient documentation

## 2016-04-24 DIAGNOSIS — Z8546 Personal history of malignant neoplasm of prostate: Secondary | ICD-10-CM | POA: Diagnosis not present

## 2016-04-24 DIAGNOSIS — D508 Other iron deficiency anemias: Secondary | ICD-10-CM

## 2016-04-24 LAB — FERRITIN: Ferritin: 8 ng/ml — ABNORMAL LOW (ref 22–316)

## 2016-04-24 LAB — CBC & DIFF AND RETIC
BASO%: 0.9 % (ref 0.0–2.0)
BASOS ABS: 0.1 10*3/uL (ref 0.0–0.1)
EOS%: 5.4 % (ref 0.0–7.0)
Eosinophils Absolute: 0.3 10*3/uL (ref 0.0–0.5)
HEMATOCRIT: 31.7 % — AB (ref 38.4–49.9)
HEMOGLOBIN: 9.5 g/dL — AB (ref 13.0–17.1)
Immature Retic Fract: 14.9 % — ABNORMAL HIGH (ref 3.00–10.60)
LYMPH#: 1.7 10*3/uL (ref 0.9–3.3)
LYMPH%: 31.7 % (ref 14.0–49.0)
MCH: 20.5 pg — ABNORMAL LOW (ref 27.2–33.4)
MCHC: 30 g/dL — AB (ref 32.0–36.0)
MCV: 68.5 fL — ABNORMAL LOW (ref 79.3–98.0)
MONO#: 0.6 10*3/uL (ref 0.1–0.9)
MONO%: 11.6 % (ref 0.0–14.0)
NEUT#: 2.7 10*3/uL (ref 1.5–6.5)
NEUT%: 50.4 % (ref 39.0–75.0)
PLATELETS: 254 10*3/uL (ref 140–400)
RBC: 4.63 10*6/uL (ref 4.20–5.82)
RDW: 19.7 % — AB (ref 11.0–14.6)
RETIC %: 0.95 % (ref 0.80–1.80)
RETIC CT ABS: 43.99 10*3/uL (ref 34.80–93.90)
WBC: 5.4 10*3/uL (ref 4.0–10.3)

## 2016-04-24 LAB — IRON AND TIBC
%SAT: 5 % — ABNORMAL LOW (ref 20–55)
IRON: 25 ug/dL — AB (ref 42–163)
TIBC: 466 ug/dL — ABNORMAL HIGH (ref 202–409)
UIBC: 441 ug/dL — ABNORMAL HIGH (ref 117–376)

## 2016-04-24 LAB — LACTATE DEHYDROGENASE: LDH: 213 U/L (ref 125–245)

## 2016-04-24 MED ORDER — COENZYME Q10 30 MG PO CAPS: 200.0000 mg | ORAL_CAPSULE | Freq: Every day | ORAL | Status: DC

## 2016-04-24 MED ORDER — FISH OIL 1200 MG PO CAPS: 1200.0000 mg | ORAL_CAPSULE | ORAL | Status: DC

## 2016-04-24 NOTE — Assessment & Plan Note (Signed)
Iron deficiency anemia: I discussed with the patient the process of iron absorption. I counseled extensively regarding the different causes of iron deficiency including blood loss and malabsorption. Patient has had upper endoscopies and colonoscopies and did not have any clear identified source of blood loss. It is possible that the patient may still have an occult source of bleeding. However malabsorption is also a possibility. Patient did not respond to oral iron therapy.   Recommendation: 1. Blood work will be done today to recheck his CBC and iron studies, G-68 folic acid and reticulocyte count along with LDH. 2. if he is truly iron deficient, we will then proceed with 2 doses of IV iron infusion with Feraheme  I discussed with the patient that potentially there may be a need for additional IV iron infusions if the iron levels were to remain low in the future. The frequency of need of IV iron would depend on many other factors including the rate of loss and by the degree of absorption.  Patient has a prior history of prostate cancer for which she received pelvic radiation. I discussed with them that occasionally radiation to the pelvic bones could lead to long-term mild anemia. However it is not possible to confirm that even if he do a bone marrow biopsy.  Return to clinic in 3 months with recheck on iron studies and hemoglobin.

## 2016-04-24 NOTE — Progress Notes (Signed)
[name] CONSULT NOTE  Patient Care Team: Tommy Medal, MD as PCP - General (Internal Medicine)  CHIEF COMPLAINTS/PURPOSE OF CONSULTATION:  Iron deficiency anemia  HISTORY OF PRESENTING ILLNESS:  Thomas Grant 65 y.o. male is here because of chronic iron deficiency anemia. Patient was known to have iron deficiency please a couple of years ago. He was treated with oral therapy for 6-8 months in 2016. He was taken off iron and had seen gastroenterology. In June 2017 he underwent upper endoscopy and colonoscopy. He was recommended to continue oral iron therapy at that time. He discontinued oral iron therapy a few months ago. His blood work done in October 2017 revealed that he was still anemic with some degree of iron deficiency. It was felt that he was not responding to oral iron therapy and was sent to was for evaluation for intravenous iron therapy. He had even a capsule endoscopy. It was no evidence of active bleeding but there was small bowel erosions and AV malformations. Patient currently works at [employer] at [employer]. Patient's biggest complaint is related to his legs which appear to feel very achy and is uncomfortable even when he climbs upstairs. He is used to being very active walking up to 3 months a day. I reviewed her records extensively and collaborated the history with the patient.  MEDICAL HISTORY:  Past Medical History:  Diagnosis Date  . Atherosclerosis   . BPH (benign prostatic hypertrophy) with urinary retention   . History of radiation therapy 01/21/2012- 11/25   Prostate 45Gy/75fs,of 1.8Gy  . HTN (hypertension)    not actively treated  . Hypercholesterolemia   . Left bundle branch block (LBBB)   . Prostate cancer ([facility] 10/10/11 bx---  s/p radioactive seed implants 03-19-2012   Adenocarcinoma,gleason=3+3=6,& 4+3=7,volume=75.8cc,Psa=8.29  . Self-catheterizes urinary bladder    3 to 4 times daily    SURGICAL HISTORY: Past Surgical  History:  Procedure Laterality Date  . CARDIAC CATHETERIZATION N/A 01/10/2015   Procedure: Left Heart Cath and Coronary Angiography;  Surgeon: WJacolyn Reedy MD;  [facility];  Service: Cardiovascular;  Laterality: N/A;  . CYSTOSCOPY  03/19/2012   Procedure: CYSTOSCOPY FLEXIBLE;  Surgeon: SFranchot Gallo MD;  [facility]  Service: Urology;  Laterality: N/A;  no seeds found in bladder  . PROSTATE BIOPSY  10/10/11   Adenocarcinoma,gleason= 3+3=6,& 4+3=7,volume=75.8cc,Psa=8.29  . RADIOACTIVE SEED IMPLANT  03/19/2012   Procedure: RADIOACTIVE SEED IMPLANT;  Surgeon: SFranchot Gallo MD;  [facility]  Service: Urology;  Laterality: N/A;  70  seeds implanted  . TRANSURETHRAL RESECTION OF PROSTATE N/A 08/31/2012   Procedure: TRANSURETHRAL RESECTION OF THE PROSTATE WITH GYRUS INSTRUMENTS;  Surgeon: SFranchot Gallo MD;  [facility]  Service: Urology;  Laterality: N/A;    SOCIAL HISTORY: Social History   Social History  . Marital status: Married    Spouse name: MLeonia Reader . Number of children: 0  . Years of education: N/A   Occupational History  . [employer]  . Smoking status: Former Smoker    Packs/day: 1.00    Years: 25.00    Types: Cigarettes  . Smokeless tobacco: Never Used  . Alcohol use 0.0 oz/week     Comment: occasionally beer 1-2/day  . Drug use: No  . Sexual activity: Not on file   Other Topics Concern  . Not on file   Social  History Narrative  . No narrative on file    FAMILY HISTORY: Family History  Problem Relation Age of Onset  . Prostate cancer Brother 17    seed implant  . Diabetes type II Mother     Lupus alive at age 64  . Alzheimer's disease Father     deceased age 35    ALLERGIES:  has No Known Allergies.  MEDICATIONS:  Current Outpatient Prescriptions  Medication Sig Dispense Refill  .  carvedilol (COREG) 3.125 MG tablet Take 6.5 mg by mouth 2 (two) times daily with a meal.    . co-enzyme Q-10 30 MG capsule Take 7 capsules (210 mg total) by mouth daily.    Marland Kitchen lisinopril (PRINIVIL,ZESTRIL) 5 MG tablet Take 5 mg by mouth daily.    Derrill Memo ON 04/25/2016] Omega-3 Fatty Acids (FISH OIL) 1200 MG CAPS Take 1 capsule (1,200 mg total) by mouth 2 (two) times a week.    . vitamin C (ASCORBIC ACID) 500 MG tablet Take 1,000 mg by mouth 2 (two) times daily.     . Zinc 50 MG CAPS Take 50 mg by mouth daily.     No current facility-administered medications for this visit.     REVIEW OF SYSTEMS:   Constitutional: Denies fevers, chills or abnormal night sweats Eyes: Denies blurriness of vision, double vision or watery eyes Ears, nose, mouth, throat, and face: Denies mucositis or sore throat Respiratory: Denies cough, dyspnea or wheezes Cardiovascular: Denies palpitation, chest discomfort or lower extremity swelling Gastrointestinal:  Denies nausea, heartburn or change in bowel habits Skin: Denies abnormal skin rashes Lymphatics: Denies new lymphadenopathy or easy bruising Neurological:Denies numbness, tingling or new weaknesses Behavioral/Psych: Mood is stable, no new changes  All other systems were reviewed with the patient and are negative.  PHYSICAL EXAMINATION: ECOG PERFORMANCE STATUS: 1 - Symptomatic but completely ambulatory  Vitals:   04/24/16 1306  BP: (!) 155/65  Pulse: (!) 59  Resp: 18  Temp: 97.8 F (36.6 C)   Filed Weights   04/24/16 1306  Weight: 166 lb 12.8 oz (75.7 kg)    GENERAL:alert, no distress and comfortable SKIN: skin color, texture, turgor are normal, no rashes or significant lesions EYES: normal, conjunctiva are pink and non-injected, sclera clear OROPHARYNX:no exudate, no erythema and lips, buccal mucosa, and tongue normal  NECK: supple, thyroid normal size, non-tender, without nodularity LYMPH:  no palpable lymphadenopathy in the cervical, axillary  or inguinal LUNGS: clear to auscultation and percussion with normal breathing effort HEART: regular rate & rhythm and no murmurs and no lower extremity edema ABDOMEN:abdomen soft, non-tender and normal bowel sounds Musculoskeletal:no cyanosis of digits and no clubbing  PSYCH: alert & oriented x 3 with fluent speech NEURO: no focal motor/sensory deficits   LABORATORY DATA:  I have reviewed the data as listed Lab Results  Component Value Date   WBC 5.4 04/24/2016   HGB 9.5 (L) 04/24/2016   HCT 31.7 (L) 04/24/2016   MCV 68.5 (L) 04/24/2016   PLT 254 04/24/2016   Lab Results  Component Value Date   NA 141 08/31/2012   K 4.0 08/31/2012   CL 109 08/31/2012   CO2 28 03/12/2012    RADIOGRAPHIC STUDIES: I have personally reviewed the radiological reports and agreed with the findings in the report.  ASSESSMENT AND PLAN:  Iron deficiency anemia Iron deficiency anemia: I discussed with the patient the process of iron absorption. I counseled extensively regarding the different causes of iron deficiency including blood loss and malabsorption.  Patient has had upper endoscopies and colonoscopies and did not have any clear identified source of blood loss. It is possible that the patient may still have an occult source of bleeding. However malabsorption is also a possibility. Patient did not respond to oral iron therapy.   Lab work review: Hemoglobin is 9.5 with an MCV of 68.5 iron studies and ferritin are pending Recommendation: 1. Blood work will be done today to recheck his CBC and iron studies, A-67 folic acid and reticulocyte count along with LDH. 2. if he is truly iron deficient, we will then proceed with 2 doses of IV iron infusion with Feraheme  I discussed with the patient that potentially there may be a need for additional IV iron infusions if the iron levels were to remain low in the future. The frequency of need of IV iron would depend on many other factors including the rate of  loss and by the degree of absorption.  Patient has a prior history of prostate cancer for which she received pelvic radiation. I discussed with them that occasionally radiation to the pelvic bones could lead to long-term mild anemia. However it is not possible to confirm that even if he do a bone marrow biopsy.  Return to clinic in 3 months with recheck on iron studies and hemoglobin.  All questions were answered. The patient knows to call the clinic with any problems, questions or concerns.    Rulon Eisenmenger, MD 04/24/16

## 2016-04-25 ENCOUNTER — Other Ambulatory Visit: Payer: Self-pay | Admitting: Hematology and Oncology

## 2016-04-25 ENCOUNTER — Other Ambulatory Visit: Payer: Self-pay

## 2016-04-25 ENCOUNTER — Telehealth: Payer: Self-pay

## 2016-04-25 LAB — VITAMIN B12: VITAMIN B 12: 857 pg/mL (ref 232–1245)

## 2016-04-25 LAB — FOLATE: Folate: 10.6 ng/mL (ref >3.0)

## 2016-04-25 NOTE — Progress Notes (Signed)
Patient was found to have profound iron deficiency anemia and will be scheduled next week to start IV iron I will like to see the patient back in 3 months with blood work done ahead of time.

## 2016-04-25 NOTE — Telephone Encounter (Signed)
Spoke with pt to let him know that Dr.Gudena would like him to come for iron infusions x2 starting on 2/1 and 2/8. Pt confirmed appt with time and date for infusion and what to expect during treatment. Pt will need to come back in April for lab and follow up with MD. Thomas Grant pt new time and date and pt confirmed appts. Pt will call if he has any other questions or concerns in the future.

## 2016-05-02 ENCOUNTER — Ambulatory Visit (HOSPITAL_BASED_OUTPATIENT_CLINIC_OR_DEPARTMENT_OTHER): Payer: BLUE CROSS/BLUE SHIELD

## 2016-05-02 ENCOUNTER — Ambulatory Visit: Payer: BLUE CROSS/BLUE SHIELD

## 2016-05-02 VITALS — BP 147/64 | HR 60 | Temp 98.9°F | Resp 18

## 2016-05-02 DIAGNOSIS — D508 Other iron deficiency anemias: Secondary | ICD-10-CM

## 2016-05-02 DIAGNOSIS — D509 Iron deficiency anemia, unspecified: Secondary | ICD-10-CM | POA: Diagnosis not present

## 2016-05-02 MED ORDER — SODIUM CHLORIDE 0.9 % IV SOLN
Freq: Once | INTRAVENOUS | Status: AC
  Administered 2016-05-02: 15:00:00 via INTRAVENOUS

## 2016-05-02 MED ORDER — SODIUM CHLORIDE 0.9 % IV SOLN
510.0000 mg | Freq: Once | INTRAVENOUS | Status: AC
  Administered 2016-05-02: 510 mg via INTRAVENOUS
  Filled 2016-05-02: qty 17

## 2016-05-02 NOTE — Patient Instructions (Signed)
Ferumoxytol injection What is this medicine? FERUMOXYTOL is an iron complex. Iron is used to make healthy red blood cells, which carry oxygen and nutrients throughout the body. This medicine is used to treat iron deficiency anemia in people with chronic kidney disease. COMMON BRAND NAME(S): Feraheme What should I tell my health care provider before I take this medicine? They need to know if you have any of these conditions: -anemia not caused by low iron levels -high levels of iron in the blood -magnetic resonance imaging (MRI) test scheduled -an unusual or allergic reaction to iron, other medicines, foods, dyes, or preservatives -pregnant or trying to get pregnant -breast-feeding How should I use this medicine? This medicine is for injection into a vein. It is given by a health care professional in a hospital or clinic setting. Talk to your pediatrician regarding the use of this medicine in children. Special care may be needed. What if I miss a dose? It is important not to miss your dose. Call your doctor or health care professional if you are unable to keep an appointment. What may interact with this medicine? This medicine may interact with the following medications: -other iron products What should I watch for while using this medicine? Visit your doctor or healthcare professional regularly. Tell your doctor or healthcare professional if your symptoms do not start to get better or if they get worse. You may need blood work done while you are taking this medicine. You may need to follow a special diet. Talk to your doctor. Foods that contain iron include: whole grains/cereals, dried fruits, beans, or peas, leafy green vegetables, and organ meats (liver, kidney). What side effects may I notice from receiving this medicine? Side effects that you should report to your doctor or health care professional as soon as possible: -allergic reactions like skin rash, itching or hives, swelling of the  face, lips, or tongue -breathing problems -changes in blood pressure -feeling faint or lightheaded, falls -fever or chills -flushing, sweating, or hot feelings -swelling of the ankles or feet Side effects that usually do not require medical attention (report to your doctor or health care professional if they continue or are bothersome): -diarrhea -headache -nausea, vomiting -stomach pain Where should I keep my medicine? This drug is given in a hospital or clinic and will not be stored at home.  2017 Elsevier/Gold Standard (2015-04-20 12:41:49)  

## 2016-05-02 NOTE — Progress Notes (Signed)
Pt received phone call from a [facility] regarding a medication that Dr.Gudena had ordered for him and will require a $300 copay. Pt was unaware of this information and unable to obtain the drug name. Told pt that pt is only to get iron infusions here at the cancer center. Pt thought it sounded odd. Called pt pharmacies to inquire about the call and neither one of them stated that they made a phone call to the pt. Told pt to not give them anymore information until if they called back. PT appreciative in investigating the legitimacy of the call.

## 2016-05-06 ENCOUNTER — Other Ambulatory Visit: Payer: Self-pay | Admitting: Hematology and Oncology

## 2016-05-06 DIAGNOSIS — D508 Other iron deficiency anemias: Secondary | ICD-10-CM

## 2016-05-09 ENCOUNTER — Ambulatory Visit (HOSPITAL_BASED_OUTPATIENT_CLINIC_OR_DEPARTMENT_OTHER): Payer: BLUE CROSS/BLUE SHIELD

## 2016-05-09 ENCOUNTER — Ambulatory Visit: Payer: BLUE CROSS/BLUE SHIELD

## 2016-05-09 DIAGNOSIS — D508 Other iron deficiency anemias: Secondary | ICD-10-CM

## 2016-05-09 DIAGNOSIS — D509 Iron deficiency anemia, unspecified: Secondary | ICD-10-CM | POA: Diagnosis not present

## 2016-05-09 MED ORDER — SODIUM CHLORIDE 0.9 % IV SOLN
Freq: Once | INTRAVENOUS | Status: AC
  Administered 2016-05-09: 15:00:00 via INTRAVENOUS

## 2016-05-09 MED ORDER — SODIUM CHLORIDE 0.9 % IV SOLN
510.0000 mg | Freq: Once | INTRAVENOUS | Status: AC
  Administered 2016-05-09: 510 mg via INTRAVENOUS
  Filled 2016-05-09: qty 17

## 2016-05-09 NOTE — Patient Instructions (Signed)
Ferumoxytol injection What is this medicine? FERUMOXYTOL is an iron complex. Iron is used to make healthy red blood cells, which carry oxygen and nutrients throughout the body. This medicine is used to treat iron deficiency anemia in people with chronic kidney disease. COMMON BRAND NAME(S): Feraheme What should I tell my health care provider before I take this medicine? They need to know if you have any of these conditions: -anemia not caused by low iron levels -high levels of iron in the blood -magnetic resonance imaging (MRI) test scheduled -an unusual or allergic reaction to iron, other medicines, foods, dyes, or preservatives -pregnant or trying to get pregnant -breast-feeding How should I use this medicine? This medicine is for injection into a vein. It is given by a health care professional in a hospital or clinic setting. Talk to your pediatrician regarding the use of this medicine in children. Special care may be needed. What if I miss a dose? It is important not to miss your dose. Call your doctor or health care professional if you are unable to keep an appointment. What may interact with this medicine? This medicine may interact with the following medications: -other iron products What should I watch for while using this medicine? Visit your doctor or healthcare professional regularly. Tell your doctor or healthcare professional if your symptoms do not start to get better or if they get worse. You may need blood work done while you are taking this medicine. You may need to follow a special diet. Talk to your doctor. Foods that contain iron include: whole grains/cereals, dried fruits, beans, or peas, leafy green vegetables, and organ meats (liver, kidney). What side effects may I notice from receiving this medicine? Side effects that you should report to your doctor or health care professional as soon as possible: -allergic reactions like skin rash, itching or hives, swelling of the  face, lips, or tongue -breathing problems -changes in blood pressure -feeling faint or lightheaded, falls -fever or chills -flushing, sweating, or hot feelings -swelling of the ankles or feet Side effects that usually do not require medical attention (report to your doctor or health care professional if they continue or are bothersome): -diarrhea -headache -nausea, vomiting -stomach pain Where should I keep my medicine? This drug is given in a hospital or clinic and will not be stored at home.  2017 Elsevier/Gold Standard (2015-04-20 12:41:49)  

## 2016-07-09 ENCOUNTER — Other Ambulatory Visit: Payer: Self-pay

## 2016-07-09 ENCOUNTER — Other Ambulatory Visit (HOSPITAL_BASED_OUTPATIENT_CLINIC_OR_DEPARTMENT_OTHER): Payer: BLUE CROSS/BLUE SHIELD

## 2016-07-09 DIAGNOSIS — D508 Other iron deficiency anemias: Secondary | ICD-10-CM

## 2016-07-09 DIAGNOSIS — Z8546 Personal history of malignant neoplasm of prostate: Secondary | ICD-10-CM | POA: Diagnosis not present

## 2016-07-09 DIAGNOSIS — D509 Iron deficiency anemia, unspecified: Secondary | ICD-10-CM

## 2016-07-10 ENCOUNTER — Other Ambulatory Visit: Payer: BLUE CROSS/BLUE SHIELD

## 2016-07-11 LAB — CELIAC PANEL 10
Antigliadin Abs, IgA: 5 units (ref 0–19)
Deamidated Gliadin Abs, IgG: 2 units (ref 0–19)
ENDOMYSIAL IGA: NEGATIVE
IgA, Qn, Serum: 186 mg/dL (ref 61–437)

## 2016-07-12 ENCOUNTER — Ambulatory Visit: Payer: BLUE CROSS/BLUE SHIELD | Admitting: Hematology and Oncology

## 2016-07-12 ENCOUNTER — Ambulatory Visit (HOSPITAL_BASED_OUTPATIENT_CLINIC_OR_DEPARTMENT_OTHER): Payer: BLUE CROSS/BLUE SHIELD | Admitting: Hematology and Oncology

## 2016-07-12 ENCOUNTER — Telehealth: Payer: Self-pay | Admitting: Hematology and Oncology

## 2016-07-12 ENCOUNTER — Encounter: Payer: Self-pay | Admitting: Hematology and Oncology

## 2016-07-12 ENCOUNTER — Other Ambulatory Visit: Payer: Self-pay | Admitting: Hematology and Oncology

## 2016-07-12 ENCOUNTER — Ambulatory Visit (HOSPITAL_BASED_OUTPATIENT_CLINIC_OR_DEPARTMENT_OTHER): Payer: BLUE CROSS/BLUE SHIELD

## 2016-07-12 DIAGNOSIS — D508 Other iron deficiency anemias: Secondary | ICD-10-CM

## 2016-07-12 DIAGNOSIS — D509 Iron deficiency anemia, unspecified: Secondary | ICD-10-CM

## 2016-07-12 LAB — CBC WITH DIFFERENTIAL/PLATELET
BASO%: 0.9 % (ref 0.0–2.0)
BASOS ABS: 0 10*3/uL (ref 0.0–0.1)
EOS ABS: 0.3 10*3/uL (ref 0.0–0.5)
EOS%: 5.6 % (ref 0.0–7.0)
HCT: 42 % (ref 38.4–49.9)
HEMOGLOBIN: 14.1 g/dL (ref 13.0–17.1)
LYMPH#: 1.6 10*3/uL (ref 0.9–3.3)
LYMPH%: 31.9 % (ref 14.0–49.0)
MCH: 28.4 pg (ref 27.2–33.4)
MCHC: 33.5 g/dL (ref 32.0–36.0)
MCV: 84.9 fL (ref 79.3–98.0)
MONO#: 0.6 10*3/uL (ref 0.1–0.9)
MONO%: 11.8 % (ref 0.0–14.0)
NEUT#: 2.5 10*3/uL (ref 1.5–6.5)
NEUT%: 49.8 % (ref 39.0–75.0)
PLATELETS: 222 10*3/uL (ref 140–400)
RBC: 4.95 10*6/uL (ref 4.20–5.82)
RDW: 27 % — AB (ref 11.0–14.6)
WBC: 5.1 10*3/uL (ref 4.0–10.3)

## 2016-07-12 LAB — IRON AND TIBC
%SAT: 22 % (ref 20–55)
Iron: 83 ug/dL (ref 42–163)
TIBC: 380 ug/dL (ref 202–409)
UIBC: 297 ug/dL (ref 117–376)

## 2016-07-12 LAB — FERRITIN: FERRITIN: 47 ng/mL (ref 22–316)

## 2016-07-12 NOTE — Telephone Encounter (Signed)
sw pt to confirm July appt dates/times per LOS

## 2016-07-12 NOTE — Progress Notes (Signed)
Patient Care Team: Tommy Medal, MD as PCP - General (Internal Medicine)  DIAGNOSIS:  Encounter Diagnosis  Name Primary?  . Other iron deficiency anemia    IV iron infusion January 2018 CHIEF COMPLIANT:  Follow-up after IV iron infusion 3 months ago  INTERVAL HISTORY: Thomas Grant is a 66 year old with above-mentioned history of iron deficiency anemia felt to be related to malabsorption who is here for a three-month follow-up visit. He reports that after the IV iron infusion in January he felt mildly better but not significant improvement. He denies any shortness of breath to exertion. He had muscle cramps but they're not as bad as before.  REVIEW OF SYSTEMS:   Constitutional: Denies fevers, chills or abnormal weight loss Eyes: Denies blurriness of vision Ears, nose, mouth, throat, and face: Denies mucositis or sore throat Respiratory: Denies cough, dyspnea or wheezes Cardiovascular: Denies palpitation, chest discomfort Gastrointestinal:  Denies nausea, heartburn or change in bowel habits Skin: Denies abnormal skin rashes Lymphatics: Denies new lymphadenopathy or easy bruising Neurological:Denies numbness, tingling or new weaknesses Behavioral/Psych: Mood is stable, no new changes  Extremities: No lower extremity edema All other systems were reviewed with the patient and are negative.  I have reviewed the past medical history, past surgical history, social history and family history with the patient and they are unchanged from previous note.  ALLERGIES:  has No Known Allergies.  MEDICATIONS:  Current Outpatient Prescriptions  Medication Sig Dispense Refill  . carvedilol (COREG) 3.125 MG tablet Take 6.5 mg by mouth 2 (two) times daily with a meal.    . co-enzyme Q-10 30 MG capsule Take 7 capsules (210 mg total) by mouth daily.    Marland Kitchen lisinopril (PRINIVIL,ZESTRIL) 5 MG tablet Take 5 mg by mouth daily.    . Omega-3 Fatty Acids (FISH OIL) 1200 MG CAPS Take 1 capsule (1,200 mg  total) by mouth 2 (two) times a week.    . vitamin C (ASCORBIC ACID) 500 MG tablet Take 1,000 mg by mouth 2 (two) times daily.     . Zinc 50 MG CAPS Take 50 mg by mouth daily.     No current facility-administered medications for this visit.     PHYSICAL EXAMINATION: ECOG PERFORMANCE STATUS: 1 - Symptomatic but completely ambulatory  Vitals:   07/12/16 0819  BP: (!) 150/67  Pulse: (!) 58  Resp: 18  Temp: 98.2 F (36.8 C)   Filed Weights   07/12/16 0819  Weight: 167 lb 4.8 oz (75.9 kg)    GENERAL:alert, no distress and comfortable SKIN: skin color, texture, turgor are normal, no rashes or significant lesions EYES: normal, Conjunctiva are pink and non-injected, sclera clear OROPHARYNX:no exudate, no erythema and lips, buccal mucosa, and tongue normal  NECK: supple, thyroid normal size, non-tender, without nodularity LYMPH:  no palpable lymphadenopathy in the cervical, axillary or inguinal LUNGS: clear to auscultation and percussion with normal breathing effort HEART: regular rate & rhythm and no murmurs and no lower extremity edema ABDOMEN:abdomen soft, non-tender and normal bowel sounds MUSCULOSKELETAL:no cyanosis of digits and no clubbing  NEURO: alert & oriented x 3 with fluent speech, no focal motor/sensory deficits EXTREMITIES: No lower extremity edema  LABORATORY DATA:  I have reviewed the data as listed   Chemistry      Component Value Date/Time   NA 141 08/31/2012 0741   K 4.0 08/31/2012 0741   CL 109 08/31/2012 0741   CO2 28 03/12/2012 0811   BUN 23 08/31/2012 0741   CREATININE 0.80  08/31/2012 0741      Component Value Date/Time   CALCIUM 9.9 03/12/2012 0811   ALKPHOS 91 03/12/2012 0811   AST 25 03/12/2012 0811   ALT 31 03/12/2012 0811   BILITOT 0.3 03/12/2012 0811       Lab Results  Component Value Date   WBC 5.4 04/24/2016   HGB 9.5 (L) 04/24/2016   HCT 31.7 (L) 04/24/2016   MCV 68.5 (L) 04/24/2016   PLT 254 04/24/2016   NEUTROABS 2.7  04/24/2016    ASSESSMENT & PLAN:  Iron deficiency anemia Patient has had upper endoscopies and colonoscopies and did not have any clear identified source of blood loss. It is possible that the patient may still have an occult source of bleeding. However malabsorption is also a possibility. Patient did not respond to oral iron therapy.   Prior treatment: IV iron 2/1/20182 doses Lab review: Celiac antibody panel was negative. Iron studies have been drawn today and results are pending. Right iron infusion hemoglobin was 9.5 and MCV of 68.5 I will call him with the results of these tests.  Return to clinic in 3 months for labs check and follow-up     I spent 25 minutes talking to the patient of which more than half was spent in counseling and coordination of care.  No orders of the defined types were placed in this encounter.  The patient has a good understanding of the overall plan. he agrees with it. he will call with any problems that may develop before the next visit here.   Rulon Eisenmenger, MD 07/12/16

## 2016-07-12 NOTE — Assessment & Plan Note (Addendum)
Patient has had upper endoscopies and colonoscopies and did not have any clear identified source of blood loss. It is possible that the patient may still have an occult source of bleeding. However malabsorption is also a possibility. Patient did not respond to oral iron therapy.   Prior treatment: IV iron 2/1/20182 doses Lab review: Celiac antibody panel was negative. Iron studies have been drawn today and results are pending. I will call her with the results of these tests.  Return to clinic in 3 months for labs check and follow-up

## 2016-07-17 ENCOUNTER — Other Ambulatory Visit: Payer: Self-pay | Admitting: Emergency Medicine

## 2016-07-17 ENCOUNTER — Telehealth: Payer: Self-pay | Admitting: Emergency Medicine

## 2016-07-17 NOTE — Telephone Encounter (Signed)
Patient called requesting lab results; reviewed labs with Dr Lindi Adie from 07/12/16; instructed patient per Dr Lindi Adie that labs are ok and we will recheck them in 3 months as scheduled on 7/12 and he will follow up with office visit on 7/13.

## 2016-10-10 ENCOUNTER — Other Ambulatory Visit (HOSPITAL_BASED_OUTPATIENT_CLINIC_OR_DEPARTMENT_OTHER): Payer: BLUE CROSS/BLUE SHIELD

## 2016-10-10 DIAGNOSIS — D509 Iron deficiency anemia, unspecified: Secondary | ICD-10-CM | POA: Diagnosis not present

## 2016-10-10 DIAGNOSIS — D508 Other iron deficiency anemias: Secondary | ICD-10-CM

## 2016-10-10 LAB — CBC WITH DIFFERENTIAL/PLATELET
BASO%: 0.4 % (ref 0.0–2.0)
BASOS ABS: 0 10*3/uL (ref 0.0–0.1)
EOS ABS: 0.2 10*3/uL (ref 0.0–0.5)
EOS%: 4.5 % (ref 0.0–7.0)
HCT: 40.9 % (ref 38.4–49.9)
HGB: 13.8 g/dL (ref 13.0–17.1)
LYMPH%: 39.4 % (ref 14.0–49.0)
MCH: 31.3 pg (ref 27.2–33.4)
MCHC: 33.7 g/dL (ref 32.0–36.0)
MCV: 92.7 fL (ref 79.3–98.0)
MONO#: 0.7 10*3/uL (ref 0.1–0.9)
MONO%: 13.5 % (ref 0.0–14.0)
NEUT#: 2.2 10*3/uL (ref 1.5–6.5)
NEUT%: 42.2 % (ref 39.0–75.0)
Platelets: 218 10*3/uL (ref 140–400)
RBC: 4.41 10*6/uL (ref 4.20–5.82)
RDW: 12.7 % (ref 11.0–14.6)
WBC: 5.1 10*3/uL (ref 4.0–10.3)
lymph#: 2 10*3/uL (ref 0.9–3.3)

## 2016-10-10 LAB — IRON AND TIBC
%SAT: 33 % (ref 20–55)
Iron: 106 ug/dL (ref 42–163)
TIBC: 324 ug/dL (ref 202–409)
UIBC: 218 ug/dL (ref 117–376)

## 2016-10-10 LAB — FERRITIN: Ferritin: 106 ng/ml (ref 22–316)

## 2016-10-11 ENCOUNTER — Ambulatory Visit (HOSPITAL_BASED_OUTPATIENT_CLINIC_OR_DEPARTMENT_OTHER): Payer: BLUE CROSS/BLUE SHIELD | Admitting: Hematology and Oncology

## 2016-10-11 ENCOUNTER — Encounter: Payer: Self-pay | Admitting: Hematology and Oncology

## 2016-10-11 DIAGNOSIS — D509 Iron deficiency anemia, unspecified: Secondary | ICD-10-CM | POA: Diagnosis not present

## 2016-10-11 DIAGNOSIS — D508 Other iron deficiency anemias: Secondary | ICD-10-CM

## 2016-10-11 MED ORDER — ROSUVASTATIN CALCIUM 10 MG PO TABS: 10.0000 mg | ORAL_TABLET | Freq: Every day | ORAL | Status: DC

## 2016-10-11 NOTE — Progress Notes (Signed)
Patient Care Team: Tommy Medal, MD as PCP - General (Internal Medicine)  DIAGNOSIS:  Encounter Diagnosis  Name Primary?  . Other iron deficiency anemia     CHIEF COMPLIANT: Follow-up of iron deficiency anemia  INTERVAL HISTORY: Thomas Grant is a 66 year old with above-mentioned history of iron deficiency anemia thought to be related to malabsorption 0 IV iron therapy and had a remarkable improvement in his hemoglobin. Is here for three-month follow-up visit and reports no new symptoms or concerns. His iron levels have remained up. He denies any fatigue or shortness of breath.  REVIEW OF SYSTEMS:   Constitutional: Denies fevers, chills or abnormal weight loss Eyes: Denies blurriness of vision Ears, nose, mouth, throat, and face: Denies mucositis or sore throat Respiratory: Denies cough, dyspnea or wheezes Cardiovascular: Denies palpitation, chest discomfort Gastrointestinal:  Denies nausea, heartburn or change in bowel habits Skin: Denies abnormal skin rashes Lymphatics: Denies new lymphadenopathy or easy bruising Neurological:Denies numbness, tingling or new weaknesses Behavioral/Psych: Mood is stable, no new changes  Extremities: No lower extremity edema  All other systems were reviewed with the patient and are negative.  I have reviewed the past medical history, past surgical history, social history and family history with the patient and they are unchanged from previous note.  ALLERGIES:  has No Known Allergies.  MEDICATIONS:  Current Outpatient Prescriptions  Medication Sig Dispense Refill  . carvedilol (COREG) 3.125 MG tablet Take 6.5 mg by mouth 2 (two) times daily with a meal.    . co-enzyme Q-10 30 MG capsule Take 7 capsules (210 mg total) by mouth daily.    Marland Kitchen lisinopril (PRINIVIL,ZESTRIL) 5 MG tablet Take 5 mg by mouth daily.    . Omega-3 Fatty Acids (FISH OIL) 1200 MG CAPS Take 1 capsule (1,200 mg total) by mouth 2 (two) times a week.    . vitamin C  (ASCORBIC ACID) 500 MG tablet Take 1,000 mg by mouth 2 (two) times daily.     . Zinc 50 MG CAPS Take 50 mg by mouth daily.     No current facility-administered medications for this visit.     PHYSICAL EXAMINATION: ECOG PERFORMANCE STATUS: 0 - Asymptomatic  Vitals:   10/11/16 0815  BP: 131/64  Pulse: (!) 53  Resp: 18  Temp: 97.6 F (36.4 C)   Filed Weights   10/11/16 0815  Weight: 165 lb 9.6 oz (75.1 kg)    GENERAL:alert, no distress and comfortable SKIN: skin color, texture, turgor are normal, no rashes or significant lesions EYES: normal, Conjunctiva are pink and non-injected, sclera clear OROPHARYNX:no exudate, no erythema and lips, buccal mucosa, and tongue normal  NECK: supple, thyroid normal size, non-tender, without nodularity LYMPH:  no palpable lymphadenopathy in the cervical, axillary or inguinal LUNGS: clear to auscultation and percussion with normal breathing effort HEART: regular rate & rhythm and no murmurs and no lower extremity edema ABDOMEN:abdomen soft, non-tender and normal bowel sounds MUSCULOSKELETAL:no cyanosis of digits and no clubbing  NEURO: alert & oriented x 3 with fluent speech, no focal motor/sensory deficits EXTREMITIES: No lower extremity edema  LABORATORY DATA:  I have reviewed the data as listed   Chemistry      Component Value Date/Time   NA 141 08/31/2012 0741   K 4.0 08/31/2012 0741   CL 109 08/31/2012 0741   CO2 28 03/12/2012 0811   BUN 23 08/31/2012 0741   CREATININE 0.80 08/31/2012 0741      Component Value Date/Time   CALCIUM 9.9 03/12/2012 0811  ALKPHOS 91 03/12/2012 0811   AST 25 03/12/2012 0811   ALT 31 03/12/2012 0811   BILITOT 0.3 03/12/2012 0811       Lab Results  Component Value Date   WBC 5.1 10/10/2016   HGB 13.8 10/10/2016   HCT 40.9 10/10/2016   MCV 92.7 10/10/2016   PLT 218 10/10/2016   NEUTROABS 2.2 10/10/2016    ASSESSMENT & PLAN:  Iron deficiency anemia Patient has had upper endoscopies and  colonoscopies and did not have any clear identified source of blood loss. It is possible that the patient may still have an occult source of bleeding. However malabsorption is also a possibility. Patient did not respond to oral iron therapy.   Prior treatment: IV iron 2/1/20182 doses Lab review: Celiac antibody panel was negative. Blood work done 10/10/2016: Hemoglobin 13.8, MCV 92.7, iron saturation 33%, ferritin 106 There is no evidence of iron deficiency. There is no need for additional iron infusions.  Return to clinic on an as-needed basis.  I spent 15 minutes talking to the patient of which more than half was spent in counseling and coordination of care.  No orders of the defined types were placed in this encounter.  The patient has a good understanding of the overall plan. he agrees with it. he will call with any problems that may develop before the next visit here.   Rulon Eisenmenger, MD 10/11/16

## 2016-10-11 NOTE — Assessment & Plan Note (Signed)
Patient has had upper endoscopies and colonoscopies and did not have any clear identified source of blood loss. It is possible that the patient may still have an occult source of bleeding. However malabsorption is also a possibility. Patient did not respond to oral iron therapy.   Prior treatment: IV iron 2/1/20182 doses Lab review: Celiac antibody panel was negative. Blood work done 10/10/2016: Hemoglobin 13.8, MCV 92.7, iron saturation 33%, ferritin 106 There is no evidence of iron deficiency. There is no need for additional iron infusions.  Return to clinic on an as-needed basis.

## 2017-08-28 DIAGNOSIS — H2513 Age-related nuclear cataract, bilateral: Secondary | ICD-10-CM | POA: Diagnosis not present

## 2017-09-22 DIAGNOSIS — I428 Other cardiomyopathies: Secondary | ICD-10-CM | POA: Diagnosis not present

## 2017-09-22 DIAGNOSIS — I447 Left bundle-branch block, unspecified: Secondary | ICD-10-CM | POA: Diagnosis not present

## 2017-09-22 DIAGNOSIS — I429 Cardiomyopathy, unspecified: Secondary | ICD-10-CM | POA: Diagnosis not present

## 2017-09-22 DIAGNOSIS — I2510 Atherosclerotic heart disease of native coronary artery without angina pectoris: Secondary | ICD-10-CM | POA: Diagnosis not present

## 2017-09-22 DIAGNOSIS — E785 Hyperlipidemia, unspecified: Secondary | ICD-10-CM | POA: Diagnosis not present

## 2017-10-07 ENCOUNTER — Encounter: Payer: Self-pay | Admitting: Hematology and Oncology

## 2017-10-07 DIAGNOSIS — I10 Essential (primary) hypertension: Secondary | ICD-10-CM | POA: Diagnosis not present

## 2017-10-07 DIAGNOSIS — D509 Iron deficiency anemia, unspecified: Secondary | ICD-10-CM | POA: Diagnosis not present

## 2017-10-07 DIAGNOSIS — Z125 Encounter for screening for malignant neoplasm of prostate: Secondary | ICD-10-CM | POA: Diagnosis not present

## 2017-10-07 DIAGNOSIS — E7800 Pure hypercholesterolemia, unspecified: Secondary | ICD-10-CM | POA: Diagnosis not present

## 2017-10-14 DIAGNOSIS — D509 Iron deficiency anemia, unspecified: Secondary | ICD-10-CM | POA: Diagnosis not present

## 2017-10-14 DIAGNOSIS — I2510 Atherosclerotic heart disease of native coronary artery without angina pectoris: Secondary | ICD-10-CM | POA: Diagnosis not present

## 2017-10-14 DIAGNOSIS — E7800 Pure hypercholesterolemia, unspecified: Secondary | ICD-10-CM | POA: Diagnosis not present

## 2017-10-14 DIAGNOSIS — C61 Malignant neoplasm of prostate: Secondary | ICD-10-CM | POA: Diagnosis not present

## 2017-10-14 DIAGNOSIS — Z0000 Encounter for general adult medical examination without abnormal findings: Secondary | ICD-10-CM | POA: Diagnosis not present

## 2017-10-14 DIAGNOSIS — I10 Essential (primary) hypertension: Secondary | ICD-10-CM | POA: Diagnosis not present

## 2018-01-23 ENCOUNTER — Telehealth: Payer: Self-pay | Admitting: Cardiology

## 2018-01-23 MED ORDER — LISINOPRIL 5 MG PO TABS: 5.0000 mg | ORAL_TABLET | Freq: Every day | ORAL | 8 refills | Status: DC

## 2018-01-23 NOTE — Addendum Note (Signed)
Addended by: Austin Miles on: 01/23/2018 03:45 PM   Modules accepted: Orders

## 2018-01-23 NOTE — Telephone Encounter (Signed)
° ° °  1. Which medications need to be refilled? (please list name of each medication and dose if known) Lisinopril 5mg  tab  2. Which pharmacy/location (including street and city if local pharmacy) is medication to be sent to? [facility] [address] [facility]  3. Do they need a 30 day or 90 day supply? [place]

## 2018-01-23 NOTE — Telephone Encounter (Signed)
Refill for lisinopril sent to [facility] in [place] as requested.

## 2018-02-23 ENCOUNTER — Other Ambulatory Visit: Payer: Self-pay | Admitting: Cardiology

## 2018-02-23 NOTE — Telephone Encounter (Signed)
Has been given to Dr. Agustin Cree for him to review

## 2018-02-23 NOTE — Telephone Encounter (Signed)
° ° °  1. Which medications need to be refilled? (please list name of each medication and dose if known) Carvedilil 6.25 mg tablet 1BID  2. Which pharmacy/location (including street and city if local pharmacy) is medication to be sent to?[facility] [address] [facility]  3. Do they need a 30 day or 90 day supply? [place]

## 2018-02-24 MED ORDER — CARVEDILOL 6.25 MG PO TABS: 6.5000 mg | ORAL_TABLET | Freq: Two times a day (BID) | ORAL | 1 refills | Status: DC

## 2018-02-24 NOTE — Telephone Encounter (Signed)
Carvedilol 6.25 mg tablet twice daily refilled per Dr. Agustin Cree.

## 2018-04-03 DIAGNOSIS — H2513 Age-related nuclear cataract, bilateral: Secondary | ICD-10-CM | POA: Diagnosis not present

## 2018-05-07 DIAGNOSIS — H2513 Age-related nuclear cataract, bilateral: Secondary | ICD-10-CM | POA: Diagnosis not present

## 2018-06-15 DIAGNOSIS — H2513 Age-related nuclear cataract, bilateral: Secondary | ICD-10-CM | POA: Diagnosis not present

## 2018-06-25 DIAGNOSIS — E7800 Pure hypercholesterolemia, unspecified: Secondary | ICD-10-CM | POA: Diagnosis not present

## 2018-06-25 DIAGNOSIS — Z0000 Encounter for general adult medical examination without abnormal findings: Secondary | ICD-10-CM | POA: Diagnosis not present

## 2018-06-25 DIAGNOSIS — I10 Essential (primary) hypertension: Secondary | ICD-10-CM | POA: Diagnosis not present

## 2018-08-19 DIAGNOSIS — H2513 Age-related nuclear cataract, bilateral: Secondary | ICD-10-CM | POA: Diagnosis not present

## 2018-08-23 ENCOUNTER — Other Ambulatory Visit: Payer: Self-pay | Admitting: Cardiology

## 2018-10-24 ENCOUNTER — Other Ambulatory Visit: Payer: Self-pay | Admitting: Cardiology

## 2018-10-29 NOTE — Telephone Encounter (Signed)
Left message informing patient he will need appointment for refill.

## 2019-01-15 DIAGNOSIS — H25013 Cortical age-related cataract, bilateral: Secondary | ICD-10-CM | POA: Diagnosis not present

## 2019-01-15 DIAGNOSIS — H2513 Age-related nuclear cataract, bilateral: Secondary | ICD-10-CM | POA: Diagnosis not present

## 2019-02-21 ENCOUNTER — Other Ambulatory Visit: Payer: Self-pay | Admitting: Cardiology

## 2019-02-23 ENCOUNTER — Telehealth: Payer: Self-pay

## 2019-02-23 NOTE — Telephone Encounter (Signed)
Former Dr. Wynonia Lawman pt, received lisinoptril refill request, reached out to patient to see if he'd like to establish care with Dr. Bettina Gavia or one of our providers. States he lives in [place] and would like to establish care with someone at [place] his home.  Message forwarded to [facility]. Scheduling, lisinipril refill being sent to [facility] [address], [place] per request.

## 2019-02-24 NOTE — Telephone Encounter (Signed)
Received lisinopril refill request, requires office visit to establish care. Patient prefers to be seen by provider at [facility]. Location. [facility]ill sent to [facility] and appt. Request sent to [facility]. Scheduling.

## 2019-03-24 ENCOUNTER — Ambulatory Visit: Payer: Self-pay | Admitting: Cardiology

## 2019-04-15 ENCOUNTER — Other Ambulatory Visit: Payer: Self-pay

## 2019-04-15 ENCOUNTER — Ambulatory Visit (INDEPENDENT_AMBULATORY_CARE_PROVIDER_SITE_OTHER): Payer: PPO | Admitting: Cardiology

## 2019-04-15 ENCOUNTER — Encounter: Payer: Self-pay | Admitting: Cardiology

## 2019-04-15 VITALS — BP 131/90 | HR 66 | Ht 71.0 in | Wt 173.9 lb

## 2019-04-15 DIAGNOSIS — I10 Essential (primary) hypertension: Secondary | ICD-10-CM | POA: Diagnosis not present

## 2019-04-15 DIAGNOSIS — I428 Other cardiomyopathies: Secondary | ICD-10-CM | POA: Diagnosis not present

## 2019-04-15 DIAGNOSIS — E782 Mixed hyperlipidemia: Secondary | ICD-10-CM

## 2019-04-15 DIAGNOSIS — I2510 Atherosclerotic heart disease of native coronary artery without angina pectoris: Secondary | ICD-10-CM

## 2019-04-15 MED ORDER — ROSUVASTATIN CALCIUM 20 MG PO TABS: 20.0000 mg | ORAL_TABLET | Freq: Every day | ORAL | 2 refills | Status: DC

## 2019-04-15 NOTE — Patient Instructions (Signed)
Increase rosuvastatin to 20 mg daily  Echocardiogram to check heart function  Monitor BP 2-3 times a week and record this to bring to your next appt  Blood work at [place] in 6 weeks  Follow up in 8 weeks

## 2019-04-15 NOTE — Progress Notes (Signed)
Primary Physician/Referring:  Deland Pretty, MD  [name], male    DOB: 02-15-1951, 69 y.o.   MRN: [id number]  Chief Complaint  Patient presents with  . Hypertension   HPI:    HPI: Thomas Grant  is a 69 y.o. Caucasian male with hypertension, hyperlipidemia, former tobacco use quit 2014, history of prostate cancer in 2013 s/p radiation therapy, moderate CAD by coronary angiogram in 2016 with Dr. Wynonia Lawman, and cardiomyopathy with last LVEF at 45-50% by echo in 2017, presents here to establish cardiac care.   He was last seen by Dr. Wynonia Lawman in 2019. He states that he is doing well since he was last seen. Denies any symptoms of chest pain, dyspnea on exertion, leg edema, PND or orthopnea, symptoms of claudication or TIA. He is tolerating medications well; however, reports gaining weight since starting the medication a few years ago.   He walks 2-3 miles per day. He is retired, previously worked with [employer]. He does drink 3 alcoholic drinks (beer) per day. Former smoker quit in 2014.   Past Medical History:  Diagnosis Date  . Atherosclerosis   . BPH (benign prostatic hypertrophy) with urinary retention   . History of radiation therapy 01/21/2012- 11/25   Prostate 45Gy/80fxs,of 1.8Gy  . HTN (hypertension)    not actively treated  . Hypercholesterolemia   . Left bundle branch block (LBBB)   . Prostate cancer ([place]) 10/10/11 bx---  s/p radioactive seed implants 03-19-2012   Adenocarcinoma,gleason=3+3=6,& 4+3=7,volume=75.8cc,Psa=8.29  . Self-catheterizes urinary bladder    3 to 4 times daily   Past Surgical History:  Procedure Laterality Date  . CARDIAC CATHETERIZATION N/A 01/10/2015   Procedure: Left Heart Cath and Coronary Angiography;  Surgeon: Jacolyn Reedy, MD;  Location: [facility];  Service: Cardiovascular;  Laterality: N/A;  . CYSTOSCOPY  03/19/2012   Procedure: CYSTOSCOPY FLEXIBLE;  Surgeon: Franchot Gallo, MD;  Location: [facility];  Service: Urology;  Laterality: N/A;  no seeds found in bladder  . PROSTATE BIOPSY  10/10/11   Adenocarcinoma,gleason= 3+3=6,& 4+3=7,volume=75.8cc,Psa=8.29  . RADIOACTIVE SEED IMPLANT  03/19/2012   Procedure: RADIOACTIVE SEED IMPLANT;  Surgeon: Franchot Gallo, MD;  Location: [facility];  Service: Urology;  Laterality: N/A;  70  seeds implanted  . TRANSURETHRAL RESECTION OF PROSTATE N/A 08/31/2012   Procedure: TRANSURETHRAL RESECTION OF THE PROSTATE WITH GYRUS INSTRUMENTS;  Surgeon: Franchot Gallo, MD;  Location: [facility];  Service: Urology;  Laterality: N/A;   Social History   Socioeconomic History  . Marital status: Married    Spouse name: Leonia Reader  . Number of children: 0  . Years of education: Not on file  . Highest education level: Not on file  Occupational History  . Occupation: Magazine features editor: [employer]  Tobacco Use  . Smoking status: Former Smoker    Packs/day: 0.25    Years: 25.00    Pack years: 6.25    Types: Cigarettes    Quit date: 2014    Years since quitting: 7.0  . Smokeless tobacco: Never Used  Substance and Sexual Activity  . Alcohol use: Yes    Alcohol/week: 0.0 standard drinks    Comment: occasionally beer 1-2/day  . Drug use: No  . Sexual activity: Not on file  Other Topics Concern  . Not on file  Social History Narrative  . Not on file   Social Determinants of Health   Financial  Resource Strain:   . Difficulty of Paying Living Expenses: Not on file  Food Insecurity:   . Worried About Charity fundraiser in the Last Year: Not on file  . Ran Out of Food in the Last Year: Not on file  Transportation Needs:   . Lack of Transportation (Medical): Not on file  . Lack of Transportation (Non-Medical): Not on file  Physical Activity:   . Days of Exercise per Week: Not on file  . Minutes of Exercise per Session: Not on file  Stress:   . Feeling of Stress : Not on file  Social  Connections:   . Frequency of Communication with Friends and Family: Not on file  . Frequency of Social Gatherings with Friends and Family: Not on file  . Attends Religious Services: Not on file  . Active Member of Clubs or Organizations: Not on file  . Attends Archivist Meetings: Not on file  . Marital Status: Not on file  Intimate Partner Violence:   . Fear of Current or Ex-Partner: Not on file  . Emotionally Abused: Not on file  . Physically Abused: Not on file  . Sexually Abused: Not on file   ROS  Review of Systems  Constitution: Negative for decreased appetite, malaise/fatigue, weight gain and weight loss.  Eyes: Negative for visual disturbance.  Cardiovascular: Negative for chest pain, claudication, dyspnea on exertion, leg swelling, orthopnea, palpitations and syncope.  Respiratory: Negative for hemoptysis and wheezing.   Endocrine: Negative for cold intolerance and heat intolerance.  Hematologic/Lymphatic: Does not bruise/bleed easily.  Skin: Negative for nail changes.  Musculoskeletal: Negative for muscle weakness and myalgias.  Gastrointestinal: Negative for abdominal pain, change in bowel habit, nausea and vomiting.  Neurological: Negative for difficulty with concentration, dizziness, focal weakness and headaches.  Psychiatric/Behavioral: Negative for altered mental status and suicidal ideas.  All other systems reviewed and are negative.  Objective  Blood pressure 131/90, pulse 66, height 5\' 11"  (1.803 m), weight 173 lb 14.4 oz (78.9 kg), SpO2 97 %. Body mass index is 24.25 kg/m.   Physical Exam  Constitutional: He is oriented to person, place, and time. Vital signs are normal. He appears well-developed and well-nourished.  HENT:  Head: Normocephalic and atraumatic.  Cardiovascular: Normal rate, regular rhythm, normal heart sounds and intact distal pulses.  Pulmonary/Chest: Effort normal and breath sounds normal. No accessory muscle usage. No respiratory  distress.  Abdominal: Soft. Bowel sounds are normal.  Musculoskeletal:        General: Normal range of motion.     Cervical back: Normal range of motion.  Neurological: He is alert and oriented to person, place, and time.  Skin: Skin is warm and dry.  Vitals reviewed.  Radiology: No results found.  Laboratory examination:   06/25/2018: Cholesterol 236, triglycerides 85, HDL 78, LDL 141.  No results for input(s): NA, K, CL, CO2, GLUCOSE, BUN, CREATININE, CALCIUM, GFRNONAA, GFRAA in the last 8760 hours. CMP Latest Ref Rng & Units 08/31/2012 04/21/2012 03/12/2012  Glucose 70 - 99 mg/dL 96 99 94  BUN 6 - 23 mg/dL 23 25(H) 19  Creatinine 0.50 - 1.35 mg/dL 0.80 1.90(H) 0.77  Sodium 135 - 145 mEq/L 141 141 139  Potassium 3.5 - 5.1 mEq/L 4.0 4.0 4.3  Chloride 96 - 112 mEq/L 109 109 103  CO2 19 - 32 mEq/L - - 28  Calcium 8.4 - 10.5 mg/dL - - 9.9  Total Protein 6.0 - 8.3 g/dL - - 7.6  Total Bilirubin  0.3 - 1.2 mg/dL - - 0.3  Alkaline Phos 39 - 117 U/L - - 91  AST 0 - 37 U/L - - 25  ALT 0 - 53 U/L - - 31   CBC Latest Ref Rng & Units 10/10/2016 07/12/2016 04/24/2016  WBC 4.0 - 10.3 10e3/uL 5.1 5.1 5.4  Hemoglobin 13.0 - 17.1 g/dL 13.8 14.1 9.5(L)  Hematocrit 38.4 - 49.9 % 40.9 42.0 31.7(L)  Platelets 140 - 400 10e3/uL 218 222 254   Lipid Panel  No results found for: CHOL, TRIG, HDL, CHOLHDL, VLDL, LDLCALC, LDLDIRECT HEMOGLOBIN A1C No results found for: HGBA1C, MPG TSH No results for input(s): TSH in the last 8760 hours. Medications   Current Outpatient Medications  Medication Instructions  . carvedilol (COREG) 6.25 MG tablet TAKE ONE TABLET BY MOUTH TWICE A DAY WITH A MEAL  . lisinopril (ZESTRIL) 5 MG tablet TAKE ONE TABLET BY MOUTH DAILY  . Multiple Vitamin (MULTIVITAMIN) capsule 1 capsule, Oral, Daily  . rosuvastatin (CRESTOR) 20 mg, Oral, Daily  . vitamin C (ASCORBIC ACID) 1,000 mg, Oral, 2 times daily    Cardiac Studies:   Echo 2017: Mild concentric LVH with low normal  decrease in global wall motion. Abnormal septal wall motion due to left bundle branch block. No wall motion abnormalities. Estimated LVEF of 45-50%. Trace mitral regurgitation.  Coronary angiogram 01/10/2015: Mid LAD lesion, 40% stenosed. Prox RCA lesion, 50% stenosed.  1.  Diffuse hypokinesis compatible with cardiomyopathy 2.  Coronary artery disease with moderate segmental mid LAD stenosis and proximal and mid right coronary artery stenosis  Assessment     ICD-10-CM   1. Atherosclerosis of native coronary artery of native heart without angina pectoris  I25.10 EKG 12-Lead    Lipid Profile  2. Nonischemic cardiomyopathy (HCC)  I42.8 PCV ECHOCARDIOGRAM COMPLETE  3. Mixed hyperlipidemia  E78.2   4. Primary hypertension  I10     EKG 04/15/2019: Normal sinus rhythm at 64 bpm, left axis deviation, left bundle branch block, no further analysis due to LBBB  Recommendations:   Thomas Grant  is a 69 y.o. Caucasian male with hypertension, hyperlipidemia, moderate coronary artery disease by coronary angiography and in 2016 with RCA 50% mid stenosis and LAD 34% stenosis, EF was around 40% of the time.  EF in 2017 has been about 45%.  He is referred to establish cardiac care.  Blood pressure is elevated today but I did not make any changes to his medications.  Will continue same dose and he will continue to monitor her blood pressure at home until next office visit with me in 6 weeks.  He would like to resume exercise especially in the form of running.  I'll recheck his echocardiogram to reevaluate his LV systolic function and as long as EF is stable we will continue present medications and he can also resume any form of exercise as long as it as mild LV systolic dysfunction.  Lipids are not at goal.  Could consider doubling up on the dose of the Crestor from 10 mg to 20 mg daily.  We will obtain a lipid profile testing in 6 weeks.  Office visit in 2 months.   *I have discussed this case with Dr.  Einar Gip and he personally examined the patient and participated in formulating the plan.*   Miquel Dunn, MSN, APRN, FNP-C [facility]. [place] Office: [phone] Fax: [phone]

## 2019-04-17 ENCOUNTER — Other Ambulatory Visit: Payer: Self-pay | Admitting: Cardiology

## 2019-04-20 ENCOUNTER — Other Ambulatory Visit: Payer: Self-pay

## 2019-04-20 ENCOUNTER — Ambulatory Visit (INDEPENDENT_AMBULATORY_CARE_PROVIDER_SITE_OTHER): Payer: PPO

## 2019-04-20 DIAGNOSIS — I428 Other cardiomyopathies: Secondary | ICD-10-CM | POA: Diagnosis not present

## 2019-04-25 ENCOUNTER — Other Ambulatory Visit: Payer: Self-pay | Admitting: Cardiology

## 2019-04-29 ENCOUNTER — Other Ambulatory Visit: Payer: Self-pay | Admitting: Cardiology

## 2019-04-30 NOTE — Telephone Encounter (Signed)
This is a Dr. Wynonia Lawman patient. Please refill as necessary.

## 2019-05-05 DIAGNOSIS — I10 Essential (primary) hypertension: Secondary | ICD-10-CM | POA: Diagnosis not present

## 2019-05-05 DIAGNOSIS — E7800 Pure hypercholesterolemia, unspecified: Secondary | ICD-10-CM | POA: Diagnosis not present

## 2019-05-10 DIAGNOSIS — K635 Polyp of colon: Secondary | ICD-10-CM | POA: Diagnosis not present

## 2019-05-10 DIAGNOSIS — E7800 Pure hypercholesterolemia, unspecified: Secondary | ICD-10-CM | POA: Diagnosis not present

## 2019-05-10 DIAGNOSIS — C61 Malignant neoplasm of prostate: Secondary | ICD-10-CM | POA: Diagnosis not present

## 2019-05-10 DIAGNOSIS — E875 Hyperkalemia: Secondary | ICD-10-CM | POA: Diagnosis not present

## 2019-05-10 DIAGNOSIS — I10 Essential (primary) hypertension: Secondary | ICD-10-CM | POA: Diagnosis not present

## 2019-05-10 DIAGNOSIS — Z0000 Encounter for general adult medical examination without abnormal findings: Secondary | ICD-10-CM | POA: Diagnosis not present

## 2019-05-16 ENCOUNTER — Other Ambulatory Visit: Payer: Self-pay | Admitting: Cardiology

## 2019-05-25 DIAGNOSIS — Z01812 Encounter for preprocedural laboratory examination: Secondary | ICD-10-CM | POA: Diagnosis not present

## 2019-05-26 ENCOUNTER — Other Ambulatory Visit: Payer: Self-pay | Admitting: Cardiology

## 2019-05-28 DIAGNOSIS — D122 Benign neoplasm of ascending colon: Secondary | ICD-10-CM | POA: Diagnosis not present

## 2019-05-28 DIAGNOSIS — K5730 Diverticulosis of large intestine without perforation or abscess without bleeding: Secondary | ICD-10-CM | POA: Diagnosis not present

## 2019-05-28 DIAGNOSIS — D128 Benign neoplasm of rectum: Secondary | ICD-10-CM | POA: Diagnosis not present

## 2019-05-28 DIAGNOSIS — D123 Benign neoplasm of transverse colon: Secondary | ICD-10-CM | POA: Diagnosis not present

## 2019-05-28 DIAGNOSIS — Z86010 Personal history of colonic polyps: Secondary | ICD-10-CM | POA: Diagnosis not present

## 2019-06-01 ENCOUNTER — Other Ambulatory Visit: Payer: Self-pay | Admitting: Cardiology

## 2019-06-01 DIAGNOSIS — D122 Benign neoplasm of ascending colon: Secondary | ICD-10-CM | POA: Diagnosis not present

## 2019-06-01 DIAGNOSIS — D123 Benign neoplasm of transverse colon: Secondary | ICD-10-CM | POA: Diagnosis not present

## 2019-06-01 DIAGNOSIS — D128 Benign neoplasm of rectum: Secondary | ICD-10-CM | POA: Diagnosis not present

## 2019-06-10 ENCOUNTER — Encounter: Payer: Self-pay | Admitting: Cardiology

## 2019-06-10 ENCOUNTER — Ambulatory Visit: Payer: PPO | Admitting: Cardiology

## 2019-06-10 ENCOUNTER — Other Ambulatory Visit: Payer: Self-pay

## 2019-06-10 VITALS — BP 144/90 | HR 53 | Temp 95.4°F | Resp 16 | Ht 71.0 in | Wt 174.6 lb

## 2019-06-10 DIAGNOSIS — I428 Other cardiomyopathies: Secondary | ICD-10-CM

## 2019-06-10 DIAGNOSIS — I2510 Atherosclerotic heart disease of native coronary artery without angina pectoris: Secondary | ICD-10-CM | POA: Diagnosis not present

## 2019-06-10 DIAGNOSIS — E782 Mixed hyperlipidemia: Secondary | ICD-10-CM | POA: Diagnosis not present

## 2019-06-10 DIAGNOSIS — I10 Essential (primary) hypertension: Secondary | ICD-10-CM

## 2019-06-10 MED ORDER — LISINOPRIL 10 MG PO TABS: 10.0000 mg | ORAL_TABLET | Freq: Every day | ORAL | 1 refills | Status: DC

## 2019-06-10 MED ORDER — LISINOPRIL-HYDROCHLOROTHIAZIDE 10-12.5 MG PO TABS: 1.0000 | ORAL_TABLET | Freq: Every morning | ORAL | 2 refills | Status: DC

## 2019-06-10 NOTE — Progress Notes (Signed)
Primary Physician/Referring:  Deland Pretty, MD  [name], male    DOB: 1950-11-01, 69 y.o.   MRN: [id number]  Chief Complaint  Patient presents with  . Cardiomyopathy  . Hyperlipidemia  . Follow-up    8 week  . Results    Echocardiogram and Labs   HPI:    HPI: Thomas Grant  is a 69 y.o. Caucasian male with hypertension, hyperlipidemia, former tobacco use quit 2014, history of prostate cancer in 2013 s/p radiation therapy, moderate CAD by coronary angiogram in 2016 with Dr. Wynonia Lawman, and cardiomyopathy with mild to moderate LV systolic dysfunction presents here for a 6-week follow-up visit.  Presently doing well, no specific complaints.  He established with me 6 weeks ago.  He is out of lisinopril for the past 3 days as he did not have refills.  He walks 2-3 miles per day. He is retired, previously worked with [employer]. He does drink 3 alcoholic drinks (beer) per day. Former smoker quit in 2014.   Past Medical History:  Diagnosis Date  . Atherosclerosis   . BPH (benign prostatic hypertrophy) with urinary retention   . History of radiation therapy 01/21/2012- 11/25   Prostate 45Gy/29fs,of 1.8Gy  . HTN (hypertension)    not actively treated  . Hypercholesterolemia   . Left bundle branch block (LBBB)   . [place]---  s/p radioactive seed implants 03-19-2012   Adenocarcinoma,gleason=3+3=6,& 4+3=7,volume=75.8cc,Psa=8.29  . Self-catheterizes urinary bladder    3 to 4 times daily   Past Surgical History:  Procedure Laterality Date  . CARDIAC CATHETERIZATION N/A 01/10/2015   Procedure: Left Heart Cath and Coronary Angiography;  Surgeon: WJacolyn Reedy MD;  [facility];  Service: Cardiovascular;  Laterality: N/A;  . CYSTOSCOPY  03/19/2012   Procedure: CYSTOSCOPY FLEXIBLE;  Surgeon: SFranchot Gallo MD;  [facility]  Service: Urology;  Laterality: N/A;  no seeds found in bladder  .  PROSTATE BIOPSY  10/10/11   Adenocarcinoma,gleason= 3+3=6,& 4+3=7,volume=75.8cc,Psa=8.29  . RADIOACTIVE SEED IMPLANT  03/19/2012   Procedure: RADIOACTIVE SEED IMPLANT;  Surgeon: SFranchot Gallo MD;  [facility]  Service: Urology;  Laterality: N/A;  70  seeds implanted  . TRANSURETHRAL RESECTION OF PROSTATE N/A 08/31/2012   Procedure: TRANSURETHRAL RESECTION OF THE PROSTATE WITH GYRUS INSTRUMENTS;  Surgeon: SFranchot Gallo MD;  [facility]  Service: Urology;  Laterality: N/A;   Social History   Socioeconomic History  . Marital status: Married    Spouse name: MLeonia Reader . Number of children: 0  . Years of education: Not on file  . Highest education level: Not on file  Occupational History  . Occupation: [employer] features editor [employer] Tobacco Use  . Smoking status: Former Smoker    Packs/day: 0.25    Years: 25.00    Pack years: 6.25    Types: Cigarettes    Quit date: 2014    Years since quitting: 7.1  . Smokeless tobacco: Never Used  Substance and Sexual Activity  . Alcohol use: Yes    Alcohol/week: 0.0 standard drinks    Comment: occasionally beer 1-2/day  . Drug use: No  . Sexual activity: Not on file  Other Topics Concern  . Not on file  Social History Narrative  . Not on file   Social Determinants of Health   Financial Resource Strain:   . Difficulty of Paying Living  Expenses:   Food Insecurity:   . Worried About Charity fundraiser in the Last Year:   . Arboriculturist in the Last Year:   Transportation Needs:   . Film/video editor (Medical):   Marland Kitchen Lack of Transportation (Non-Medical):   Physical Activity:   . Days of Exercise per Week:   . Minutes of Exercise per Session:   Stress:   . Feeling of Stress :   Social Connections:   . Frequency of Communication with Friends and Family:   . Frequency of Social Gatherings with Friends and Family:   . Attends Religious Services:   . Active  Member of Clubs or Organizations:   . Attends Archivist Meetings:   Marland Kitchen Marital Status:   Intimate Partner Violence:   . Fear of Current or Ex-Partner:   . Emotionally Abused:   Marland Kitchen Physically Abused:   . Sexually Abused:    ROS  Review of Systems  Cardiovascular: Negative for chest pain, dyspnea on exertion and leg swelling.  Gastrointestinal: Negative for melena.   Objective  Blood pressure (!) 144/90, pulse (!) 53, temperature (!) 95.4 F (35.2 C), temperature source Temporal, resp. rate 16, height 5' 11" (1.803 m), weight 174 lb 9.6 oz (79.2 kg), SpO2 97 %. Body mass index is 24.35 kg/m.   Vitals with BMI 06/10/2019 04/15/2019 10/11/2016  Height 5' 11" 5' 11" 5' 11"  Weight 174 lbs 10 oz 173 lbs 14 oz 165 lbs 10 oz  BMI 24.36 83.38 25.0  Systolic 539 767 341  Diastolic 90 90 64  Pulse 53 66 53     Physical Exam  Cardiovascular: Normal rate, regular rhythm, normal heart sounds, intact distal pulses and normal pulses. Exam reveals no gallop.  No murmur heard. (Paradoxical split in second heart sound) No leg edema, no JVD.  Pulmonary/Chest: Effort normal and breath sounds normal.  Abdominal: Soft. Bowel sounds are normal.   Radiology: No results found.  Laboratory examination:   No results for input(s): NA, K, CL, CO2, GLUCOSE, BUN, CREATININE, CALCIUM, GFRNONAA, GFRAA in the last 8760 hours. CMP Latest Ref Rng & Units 08/31/2012 04/21/2012 03/12/2012  Glucose 70 - 99 mg/dL 96 99 94  BUN 6 - 23 mg/dL 23 25(H) 19  Creatinine 0.50 - 1.35 mg/dL 0.80 1.90(H) 0.77  Sodium 135 - 145 mEq/L 141 141 139  Potassium 3.5 - 5.1 mEq/L 4.0 4.0 4.3  Chloride 96 - 112 mEq/L 109 109 103  CO2 19 - 32 mEq/L - - 28  Calcium 8.4 - 10.5 mg/dL - - 9.9  Total Protein 6.0 - 8.3 g/dL - - 7.6  Total Bilirubin 0.3 - 1.2 mg/dL - - 0.3  Alkaline Phos 39 - 117 U/L - - 91  AST 0 - 37 U/L - - 25  ALT 0 - 53 U/L - - 31   CBC Latest Ref Rng & Units 10/10/2016 07/12/2016 04/24/2016  WBC 4.0 - 10.3  10e3/uL 5.1 5.1 5.4  Hemoglobin 13.0 - 17.1 g/dL 13.8 14.1 9.5(L)  Hematocrit 38.4 - 49.9 % 40.9 42.0 31.7(L)  Platelets 140 - 400 10e3/uL 218 222 254   Lipid Panel  No results found for: CHOL, TRIG, HDL, CHOLHDL, VLDL, LDLCALC, LDLDIRECT HEMOGLOBIN A1C No results found for: HGBA1C, MPG TSH No results for input(s): TSH in the last 8760 hours.   External labs:  Labs 05/05/2019: Hb 15.2/HCT 45.0, platelets 219.  BUN 13, creatinine 1.2, EGFR  60 mL, sodium 144, potassium 5.7.  AST, ALT normal limits. Total cholesterol 245, triglycerides 101, HDL 82, LDL 146.  06/25/2018: Cholesterol 236, triglycerides 85, HDL 78, LDL 141.  Medications   Current Outpatient Medications  Medication Instructions  . carvedilol (COREG) 6.25 MG tablet TAKE ONE TABLET BY MOUTH TWICE A DAY WITH MEALS  . lisinopril-hydrochlorothiazide (ZESTORETIC) 10-12.5 MG tablet 1 tablet, Oral,  Every morning - 10a  . Multiple Vitamin (MULTIVITAMIN) capsule 1 capsule, Oral, Daily  . rosuvastatin (CRESTOR) 20 mg, Oral, Daily  . vitamin C (ASCORBIC ACID) 1,000 mg, Oral, 2 times daily    Cardiac Studies:   Echo 2017: Mild concentric LVH with low normal decrease in global wall motion. Abnormal septal wall motion due to left bundle branch block. No wall motion abnormalities. Estimated LVEF of 45-50%. Trace mitral regurgitation.  Coronary angiogram 01/10/2015: Mid LAD lesion, 40% stenosed. Prox RCA lesion, 50% stenosed. 1.  Diffuse hypokinesis compatible with cardiomyopathy 2.  Coronary artery disease with moderate segmental mid LAD stenosis and proximal and mid right coronary artery stenosis  Echocardiogram 04/20/2019:  Left ventricle cavity is normal in size. Moderate concentric hypertrophy  of the left ventricle. Abnormal septal wall motion due to left bundle  branch block. Mildly depressed LV systolic function with EF 45%. Doppler  evidence of grade I (impaired) diastolic dysfunction, normal LAP. Left  atrial cavity is  mildly dilated.  Trileaflet aortic valve. Trace aortic regurgitation.  Mild tricuspid regurgitation. Estimated pulmonary artery systolic pressure  is 24 mmHg.  No significant change compared to previous study on 09/22/2017.   EKG:  EKG 04/15/2019: Normal sinus rhythm at 64 bpm, left axis deviation, left bundle branch block, no further analysis due to LBBB  Assessment     ICD-10-CM   1. Atherosclerosis of native coronary artery of native heart without angina pectoris  I25.10   2. Nonischemic cardiomyopathy (HCC)  I42.8   3. Mixed hyperlipidemia  E78.2 Lipid Panel With LDL/HDL Ratio  4. Primary hypertension  Z61 Basic metabolic panel    lisinopril-hydrochlorothiazide (ZESTORETIC) 10-12.5 MG tablet    DISCONTINUED: lisinopril (ZESTRIL) 10 MG tablet    Recommendations:   JIOVANNY BURDELL  is a 69 y.o. Caucasian male with nonischemic cardiomyopathy with mild to moderate LV systolic dysfunction, hypertension, hyperlipidemia, moderate coronary artery disease by coronary angiography and in 2016 with RCA 50% mid stenosis and LAD 34% stenosis, EF was around 40% at that time.  No significant change in his LVEF since 2016 which is around 40 to 45% by recent echocardiogram, but pressure remains slightly elevated.  Cannot increase ACE inhibitor as he had hypokalemia by recent lab test (external labs reviewed), could consider addition of low-dose of a beta-blocker.  After discussions with the patient, I have changed lisinopril 5 mg to lisinopril HCT 10/12.5 mg in the morning.  He will continue to monitor his blood pressure closely, unless it remains >130/80, I will see him back in 6 months.  Lipids are not at goal.  He is now tolerating Crestor 20 mg.  I will repeat lipid profile testing, in weeks.   Adrian Prows, MD, [facility] 06/10/2019, 6:38 PM [facility]. [place] Office: [phone]

## 2019-07-15 ENCOUNTER — Other Ambulatory Visit: Payer: Self-pay | Admitting: Cardiology

## 2019-08-13 DIAGNOSIS — L821 Other seborrheic keratosis: Secondary | ICD-10-CM | POA: Diagnosis not present

## 2019-08-13 DIAGNOSIS — D1801 Hemangioma of skin and subcutaneous tissue: Secondary | ICD-10-CM | POA: Diagnosis not present

## 2019-08-13 DIAGNOSIS — D2272 Melanocytic nevi of left lower limb, including hip: Secondary | ICD-10-CM | POA: Diagnosis not present

## 2019-08-13 DIAGNOSIS — L720 Epidermal cyst: Secondary | ICD-10-CM | POA: Diagnosis not present

## 2019-08-13 DIAGNOSIS — L814 Other melanin hyperpigmentation: Secondary | ICD-10-CM | POA: Diagnosis not present

## 2019-08-13 DIAGNOSIS — D225 Melanocytic nevi of trunk: Secondary | ICD-10-CM | POA: Diagnosis not present

## 2019-08-13 DIAGNOSIS — D2239 Melanocytic nevi of other parts of face: Secondary | ICD-10-CM | POA: Diagnosis not present

## 2019-08-13 DIAGNOSIS — D224 Melanocytic nevi of scalp and neck: Secondary | ICD-10-CM | POA: Diagnosis not present

## 2019-09-02 ENCOUNTER — Other Ambulatory Visit: Payer: Self-pay

## 2019-09-02 DIAGNOSIS — Z86010 Personal history of colonic polyps: Secondary | ICD-10-CM | POA: Diagnosis not present

## 2019-09-02 DIAGNOSIS — D509 Iron deficiency anemia, unspecified: Secondary | ICD-10-CM | POA: Diagnosis not present

## 2019-09-02 MED ORDER — CARVEDILOL 6.25 MG PO TABS: 6.2500 mg | ORAL_TABLET | Freq: Two times a day (BID) | ORAL | 1 refills | Status: DC

## 2019-09-05 ENCOUNTER — Other Ambulatory Visit: Payer: Self-pay | Admitting: Cardiology

## 2019-09-05 DIAGNOSIS — I10 Essential (primary) hypertension: Secondary | ICD-10-CM

## 2019-10-05 DIAGNOSIS — H2513 Age-related nuclear cataract, bilateral: Secondary | ICD-10-CM | POA: Diagnosis not present

## 2019-10-05 DIAGNOSIS — H18413 Arcus senilis, bilateral: Secondary | ICD-10-CM | POA: Diagnosis not present

## 2019-10-05 DIAGNOSIS — H2512 Age-related nuclear cataract, left eye: Secondary | ICD-10-CM | POA: Diagnosis not present

## 2019-10-05 DIAGNOSIS — H353131 Nonexudative age-related macular degeneration, bilateral, early dry stage: Secondary | ICD-10-CM | POA: Diagnosis not present

## 2019-10-05 DIAGNOSIS — H25043 Posterior subcapsular polar age-related cataract, bilateral: Secondary | ICD-10-CM | POA: Diagnosis not present

## 2019-10-05 DIAGNOSIS — H25013 Cortical age-related cataract, bilateral: Secondary | ICD-10-CM | POA: Diagnosis not present

## 2019-10-13 ENCOUNTER — Other Ambulatory Visit: Payer: Self-pay | Admitting: Cardiology

## 2019-10-14 DIAGNOSIS — H2512 Age-related nuclear cataract, left eye: Secondary | ICD-10-CM | POA: Diagnosis not present

## 2019-10-15 DIAGNOSIS — H2512 Age-related nuclear cataract, left eye: Secondary | ICD-10-CM | POA: Diagnosis not present

## 2019-10-15 DIAGNOSIS — H25041 Posterior subcapsular polar age-related cataract, right eye: Secondary | ICD-10-CM | POA: Diagnosis not present

## 2019-10-15 DIAGNOSIS — H25011 Cortical age-related cataract, right eye: Secondary | ICD-10-CM | POA: Diagnosis not present

## 2019-10-15 DIAGNOSIS — H2511 Age-related nuclear cataract, right eye: Secondary | ICD-10-CM | POA: Diagnosis not present

## 2019-11-10 DIAGNOSIS — Z0000 Encounter for general adult medical examination without abnormal findings: Secondary | ICD-10-CM | POA: Diagnosis not present

## 2019-11-10 DIAGNOSIS — C61 Malignant neoplasm of prostate: Secondary | ICD-10-CM | POA: Diagnosis not present

## 2019-11-10 DIAGNOSIS — E7800 Pure hypercholesterolemia, unspecified: Secondary | ICD-10-CM | POA: Diagnosis not present

## 2019-11-10 DIAGNOSIS — I10 Essential (primary) hypertension: Secondary | ICD-10-CM | POA: Diagnosis not present

## 2019-11-19 DIAGNOSIS — H2511 Age-related nuclear cataract, right eye: Secondary | ICD-10-CM | POA: Diagnosis not present

## 2019-11-25 DIAGNOSIS — H2511 Age-related nuclear cataract, right eye: Secondary | ICD-10-CM | POA: Diagnosis not present

## 2019-12-01 ENCOUNTER — Other Ambulatory Visit: Payer: Self-pay | Admitting: Cardiology

## 2019-12-01 DIAGNOSIS — I10 Essential (primary) hypertension: Secondary | ICD-10-CM

## 2019-12-27 ENCOUNTER — Ambulatory Visit: Payer: PPO | Admitting: Cardiology

## 2019-12-27 ENCOUNTER — Other Ambulatory Visit: Payer: Self-pay

## 2019-12-27 ENCOUNTER — Encounter: Payer: Self-pay | Admitting: Cardiology

## 2019-12-27 VITALS — BP 149/71 | HR 64 | Resp 16 | Ht 71.0 in | Wt 175.0 lb

## 2019-12-27 DIAGNOSIS — I447 Left bundle-branch block, unspecified: Secondary | ICD-10-CM

## 2019-12-27 DIAGNOSIS — I2510 Atherosclerotic heart disease of native coronary artery without angina pectoris: Secondary | ICD-10-CM | POA: Diagnosis not present

## 2019-12-27 DIAGNOSIS — I10 Essential (primary) hypertension: Secondary | ICD-10-CM | POA: Diagnosis not present

## 2019-12-27 DIAGNOSIS — E782 Mixed hyperlipidemia: Secondary | ICD-10-CM

## 2019-12-27 MED ORDER — CARVEDILOL 6.25 MG PO TABS: 6.2500 mg | ORAL_TABLET | Freq: Two times a day (BID) | ORAL | 3 refills | Status: DC

## 2019-12-27 MED ORDER — LISINOPRIL-HYDROCHLOROTHIAZIDE 20-25 MG PO TABS: 1.0000 | ORAL_TABLET | Freq: Every day | ORAL | 3 refills | Status: DC

## 2019-12-27 NOTE — Progress Notes (Signed)
Primary Physician/Referring:  Deland Pretty, MD  [name], male    DOB: [birth year], 69 y.o.   MRN: [id number]  Chief Complaint  Patient presents with  . Coronary Artery Disease  . Hypertension  . Follow-up    6 month   HPI:    HPI: Thomas Grant  is a 69 y.o. Caucasian male with hypertension, hyperlipidemia, former tobacco use quit 2014, history of prostate cancer in 2013 s/p radiation therapy, moderate CAD by coronary angiogram in 2016 with Dr. Wynonia Lawman, and LBBB with cardiomyopathy with mild to moderate LV systolic dysfunction presents here for a 6 month follow-up visit.  Presently doing well, no specific complaints.  He established with me 6 weeks ago.  He is out of lisinopril for the past 3 days as he did not have refills.  He walks 2-3 miles per day. He is retired. He does drink 3 alcoholic drinks (beer) per day. Former smoker quit in 2014.   Past Medical History:  Diagnosis Date  . Atherosclerosis   . BPH (benign prostatic hypertrophy) with urinary retention   . History of radiation therapy 01/21/2012- 11/25   Prostate 45Gy/32fs,of 1.8Gy  . HTN (hypertension)    not actively treated  . Hypercholesterolemia   . Left bundle branch block (LBBB)   . Prostate cancer ([facility] 10/[place]11 bx---  s/p radioactive seed implants 03-19-2012   Adenocarcinoma,gleason=3+3=6,& 4+3=7,volume=75.8cc,Psa=8.29  . Self-catheterizes urinary bladder    3 to 4 times daily   Past Surgical History:  Procedure Laterality Date  . CARDIAC CATHETERIZATION N/A 01/10/2015   Procedure: Left Heart Cath and Coronary Angiography;  Surgeon: WJacolyn Reedy MD;  [facility];  Service: Cardiovascular;  Laterality: N/A;  . CYSTOSCOPY  03/19/2012   Procedure: CYSTOSCOPY FLEXIBLE;  Surgeon: SFranchot Gallo MD;  [facility]  Service: Urology;  Laterality: N/A;  no seeds found in bladder  . PROSTATE BIOPSY  10/10/11   Adenocarcinoma,gleason= 3+3=6,&  4+3=7,volume=75.8cc,Psa=8.29  . RADIOACTIVE SEED IMPLANT  03/19/2012   Procedure: RADIOACTIVE SEED IMPLANT;  Surgeon: SFranchot Gallo MD;  [facility]  Service: Urology;  Laterality: N/A;  70  seeds implanted  . TRANSURETHRAL RESECTION OF PROSTATE N/A 08/31/2012   Procedure: TRANSURETHRAL RESECTION OF THE PROSTATE WITH GYRUS INSTRUMENTS;  Surgeon: SFranchot Gallo MD;  [facility]  Service: Urology;  Laterality: N/A;   Social History   Tobacco Use  . Smoking status: Former Smoker    Packs/day: 0.25    Years: 25.00    Pack years: 6.25    Types: Cigarettes    Quit date: 2014    Years since quitting: 7.7  . Smokeless tobacco: Never Used  Substance Use Topics  . Alcohol use: Yes    Alcohol/week: 0.0 standard drinks    Comment: occasionally beer 1-2/day   Marital Status: Married   ROS  Review of Systems  Cardiovascular: Negative for chest pain, dyspnea on exertion and leg swelling.  Gastrointestinal: Negative for melena.   Objective  Blood pressure (!) 149/71, pulse 64, resp. rate 16, height _0  (1.803 m), weight 175 lb (79.4 kg), SpO2 98 %. Body mass index is 24.41 kg/m.   Vitals with BMI 12/27/2019 06/10/2019 04/15/2019  Height _1  _2  _3   Weight 175 lbs 174 lbs 10 oz 173 lbs 14 oz  BMI 24.42 266.29247.65 Systolic [id number] Diastolic 71 90 90  Pulse 64 53 66  Physical Exam Cardiovascular:     Rate and Rhythm: Normal rate and regular rhythm.     Pulses: Normal pulses and intact distal pulses.     Heart sounds: Normal heart sounds. No murmur heard.  No gallop.      Comments: (Paradoxical split in second heart sound) No leg edema, no JVD. Pulmonary:     Effort: Pulmonary effort is normal.     Breath sounds: Normal breath sounds.  Abdominal:     General: Bowel sounds are normal.     Palpations: Abdomen is soft.    Radiology: No results found.  Laboratory examination:   No results for input(s):  NA, K, CL, CO2, GLUCOSE, BUN, CREATININE, CALCIUM, GFRNONAA, GFRAA in the last 8760 hours. CMP Latest Ref Rng & Units 08/31/2012 04/21/2012 03/12/2012  Glucose 70 - 99 mg/dL 96 99 94  BUN 6 - 23 mg/dL 23 25(H) 19  Creatinine 0.50 - 1.35 mg/dL 0.80 1.90(H) 0.77  Sodium 135 - 145 mEq/L 141 141 139  Potassium 3.5 - 5.1 mEq/L 4.0 4.0 4.3  Chloride 96 - 112 mEq/L 109 109 103  CO2 19 - 32 mEq/L - - 28  Calcium 8.4 - 10.5 mg/dL - - 9.9  Total Protein 6.0 - 8.3 g/dL - - 7.6  Total Bilirubin 0.3 - 1.2 mg/dL - - 0.3  Alkaline Phos 39 - 117 U/L - - 91  AST 0 - 37 U/L - - 25  ALT 0 - 53 U/L - - 31   CBC Latest Ref Rng & Units 10/10/2016 07/12/2016 04/24/2016  WBC 4.0 - 10.3 10e3/uL 5.1 5.1 5.4  Hemoglobin 13.0 - 17.1 g/dL 13.8 14.1 9.5(L)  Hematocrit 38 - 49 % 40.9 42.0 31.7(L)  Platelets 140 - 400 10e3/uL 218 222 254   Lipid Panel  No results found for: CHOL, TRIG, HDL, CHOLHDL, VLDL, LDLCALC, LDLDIRECT HEMOGLOBIN A1C No results found for: HGBA1C, MPG TSH No results for input(s): TSH in the last 8760 hours.   External labs:  Cholesterol, total 236.000 11/10/2019 HDL 77.000 MG 11/10/2019 LDL-C 141.000 11/10/2019 Triglycerides 103.000 11/10/2019  Hemoglobin 13.800 10/10/2016  Creatinine, Serum 0.830 MG/ 11/10/2019 Potassium N/D ALT (SGPT) 31.000 IU/ 11/10/2019   Labs 05/05/2019: Hb 15.2/HCT 45.0, platelets 219.  BUN 13, creatinine 1.2, EGFR  60 mL, sodium 144, potassium 5.7.  AST, ALT normal limits. Total cholesterol 245, triglycerides 101, HDL 82, LDL 146.  06/25/2018: Cholesterol 236, triglycerides 85, HDL 78, LDL 141.  Medications   Current Outpatient Medications on File Prior to Visit  Medication Sig Dispense Refill  . co-enzyme Q-10 50 MG capsule Take 50 mg by mouth daily.    . Multiple Vitamin (MULTIVITAMIN) capsule Take 1 capsule by mouth daily.    . rosuvastatin (CRESTOR) 20 MG tablet TAKE ONE TABLET BY MOUTH DAILY 90 tablet 2  . vitamin C (ASCORBIC ACID) 500 MG tablet Take 1,000  mg by mouth 2 (two) times daily.      No current facility-administered medications on file prior to visit.    Cardiac Studies:   Echo 2017: Mild concentric LVH with low normal decrease in global wall motion. Abnormal septal wall motion due to left bundle branch block. No wall motion abnormalities. Estimated LVEF of 45-50%. Trace mitral regurgitation.  Coronary angiogram 01/10/2015: Mid LAD lesion, 40% stenosed. Prox RCA lesion, 50% stenosed. 1.  Diffuse hypokinesis compatible with cardiomyopathy 2.  Coronary artery disease with moderate segmental mid LAD stenosis and proximal and mid right coronary artery stenosis  Echocardiogram 04/20/2019:  Left ventricle cavity is normal in size. Moderate concentric hypertrophy  of the left ventricle. Abnormal septal wall motion due to left bundle  branch block. Mildly depressed LV systolic function with EF 45%. Doppler  evidence of grade I (impaired) diastolic dysfunction, normal LAP. Left  atrial cavity is mildly dilated.  Trileaflet aortic valve. Trace aortic regurgitation.  Mild tricuspid regurgitation. Estimated pulmonary artery systolic pressure  is 24 mmHg.  No significant change compared to previous study on 09/22/2017.   EKG:  EKG 12/27/2019: Normal sinus rhythm at rate of 64 bpm, normal axis, poor R wave progression, cannot exclude anteroseptal infarct old.  No evidence of ischemia.    EKG 04/15/2019: Normal sinus rhythm at 64 bpm, left axis deviation, left bundle branch block, no further analysis due to LBBB  Assessment     ICD-10-CM   1. Atherosclerosis of native coronary artery of native heart without angina pectoris  I25.10 EKG 12-Lead    carvedilol (COREG) 6.25 MG tablet  2. Primary hypertension  I10 lisinopril-hydrochlorothiazide (ZESTORETIC) 20-25 MG tablet    Basic metabolic panel    TSH  3. Mixed hyperlipidemia  E78.2 Lipid Panel With LDL/HDL Ratio  4. LBBB (left bundle branch block)  I44.7    Meds ordered this encounter    Medications  . carvedilol (COREG) 6.25 MG tablet    Sig: Take 1 tablet (6.25 mg total) by mouth 2 (two) times daily with a meal.    Dispense:  180 tablet    Refill:  3    Needs appointment  . lisinopril-hydrochlorothiazide (ZESTORETIC) 20-25 MG tablet    Sig: Take 1 tablet by mouth daily.    Dispense:  90 tablet    Refill:  3   Medications Discontinued During This Encounter  Medication Reason  . lisinopril-hydrochlorothiazide (ZESTORETIC) 10-12.5 MG tablet Dose change  . carvedilol (COREG) 6.25 MG tablet Reorder    Recommendations:   Thomas Grant  is a 69 y.o. Caucasian male with nonischemic cardiomyopathy with mild  LV systolic dysfunction, chronic LBBB, hypertension, hyperlipidemia, moderate coronary artery disease by coronary angiography and in 2016 with RCA 50% mid stenosis and LAD 34% stenosis, EF was around 40% at that time.  He presents here for 70-monthoffice visit.  He is tolerating all his medications well and has noticed elevated blood pressure.  Will increase lisinopril HCT from 10/12.5 mg to 20/25 mg in the morning, his potassium was borderline elevated previously.  Will recheck BMP in 2 weeks.  With regard to hyperlipidemia, I had started him on Crestor 20 mg which he is tolerating however I reviewed his external labs from PCP, there is no change in his LDL at all.  Whether this is related to efficacy or lab error I am not sure, will repeat lipid profile testing in 2 weeks.  With regard to underlying left bundle branch block, today EKG does not reveal LBBB.  He may have intermittent LBBB.  He remains asymptomatic without dizziness or syncope, I will see him back in 4 weeks for follow-up of specifically hypertension.  CAD has remained stable without angina.    JAdrian Prows MD, [facility]9/27/2021, 3:42 PM Office: [phone]

## 2020-01-07 DIAGNOSIS — I10 Essential (primary) hypertension: Secondary | ICD-10-CM | POA: Diagnosis not present

## 2020-01-07 DIAGNOSIS — E782 Mixed hyperlipidemia: Secondary | ICD-10-CM | POA: Diagnosis not present

## 2020-01-08 LAB — BASIC METABOLIC PANEL
BUN/Creatinine Ratio: 20 (ref 10–24)
BUN: 16 mg/dL (ref 8–27)
CO2: 25 mmol/L (ref 20–29)
Calcium: 9.2 mg/dL (ref 8.6–10.2)
Chloride: 96 mmol/L (ref 96–106)
Creatinine, Ser: 0.82 mg/dL (ref 0.76–1.27)
GFR calc Af Amer: 104 mL/min/{1.73_m2} (ref >59)
GFR calc non Af Amer: 90 mL/min/{1.73_m2} (ref >59)
Glucose: 94 mg/dL (ref 65–99)
Potassium: 5.3 mmol/L — ABNORMAL HIGH (ref 3.5–5.2)
Sodium: 135 mmol/L (ref 134–144)

## 2020-01-08 LAB — LIPID PANEL WITH LDL/HDL RATIO
Cholesterol, Total: 217 mg/dL — ABNORMAL HIGH (ref 100–199)
HDL: 71 mg/dL (ref >39)
LDL Chol Calc (NIH): 134 mg/dL — ABNORMAL HIGH (ref 0–99)
LDL/HDL Ratio: 1.9 ratio (ref 0.0–3.6)
Triglycerides: 70 mg/dL (ref 0–149)
VLDL Cholesterol Cal: 12 mg/dL (ref 5–40)

## 2020-01-08 LAB — TSH: TSH: 3.71 u[IU]/mL (ref 0.450–4.500)

## 2020-01-29 ENCOUNTER — Ambulatory Visit: Payer: PPO | Attending: Internal Medicine

## 2020-01-29 DIAGNOSIS — Z23 Encounter for immunization: Secondary | ICD-10-CM

## 2020-01-29 NOTE — Progress Notes (Signed)
° °  Covid-19 Vaccination Clinic  Name:  Thomas Grant    MRN: [id number] DOB: 1950-07-22  01/29/2020  Thomas Grant was observed post Covid-19 immunization for 15 minutes without incident. He was provided with Vaccine Information Sheet and instruction to access the V-Safe system.   Thomas Grant was instructed to call [phone] with any severe reactions post vaccine:  Difficulty breathing   Swelling of face and throat   A fast heartbeat   A bad rash all over body   Dizziness and weakness

## 2020-02-01 ENCOUNTER — Ambulatory Visit: Payer: PPO | Admitting: Cardiology

## 2020-02-08 ENCOUNTER — Ambulatory Visit: Payer: PPO | Admitting: Cardiology

## 2020-02-08 ENCOUNTER — Encounter: Payer: Self-pay | Admitting: Cardiology

## 2020-02-08 ENCOUNTER — Other Ambulatory Visit: Payer: Self-pay

## 2020-02-08 VITALS — BP 121/64 | HR 64 | Resp 16 | Ht 71.0 in | Wt 171.0 lb

## 2020-02-08 DIAGNOSIS — E782 Mixed hyperlipidemia: Secondary | ICD-10-CM | POA: Diagnosis not present

## 2020-02-08 DIAGNOSIS — I2510 Atherosclerotic heart disease of native coronary artery without angina pectoris: Secondary | ICD-10-CM | POA: Diagnosis not present

## 2020-02-08 DIAGNOSIS — I10 Essential (primary) hypertension: Secondary | ICD-10-CM | POA: Diagnosis not present

## 2020-02-08 MED ORDER — ATORVASTATIN CALCIUM 40 MG PO TABS: 40.0000 mg | ORAL_TABLET | Freq: Every day | ORAL | 3 refills | Status: DC

## 2020-02-08 NOTE — Patient Instructions (Signed)
Labs at [facility] in 4-6 weeks.

## 2020-02-08 NOTE — Progress Notes (Signed)
Primary Physician/Referring:  Deland Pretty, MD  [name], male    DOB: 1950-10-28, 69 y.o.   MRN: [id number]  Chief Complaint  Patient presents with  . Hypertension  . Hyperlipidemia  . Coronary Artery Disease  . Follow-up    4 week   HPI:    HPI: Thomas Grant  is a 69 y.o. Caucasian male with hypertension, hyperlipidemia, former tobacco use quit 2014, history of prostate cancer in 2013 s/p radiation therapy, moderate CAD by coronary angiogram in 2016 with Dr. Wynonia Lawman, and LBBB with cardiomyopathy with mild to moderate LV systolic dysfunction.   Presents for 4 week follow up on hyperlipidemia and hypertension. At last visit increased lisinopril/HCT from 10/12.5 mg to 20/25 mg in the morning.  Patient is tolerating medication change well.  Denies dizziness, syncope, near-syncope, chest pain, shortness of breath. He reports he has continued to take Crestor 20 mg daily.   Past Medical History:  Diagnosis Date  . Atherosclerosis   . BPH (benign prostatic hypertrophy) with urinary retention   . History of radiation therapy 01/21/2012- 11/25   Prostate 45Gy/53fs,of 1.8Gy  . HTN (hypertension)    not actively treated  . Hypercholesterolemia   . Left bundle branch block (LBBB)   . [place]---  s/p radioactive seed implants 03-19-2012   Adenocarcinoma,gleason=3+3=6,& 4+3=7,volume=75.8cc,Psa=8.29  . Self-catheterizes urinary bladder    3 to 4 times daily   Past Surgical History:  Procedure Laterality Date  . CARDIAC CATHETERIZATION N/A 01/10/2015   Procedure: Left Heart Cath and Coronary Angiography;  Surgeon: WJacolyn Reedy MD;  [facility];  Service: Cardiovascular;  Laterality: N/A;  . CYSTOSCOPY  03/19/2012   Procedure: CYSTOSCOPY FLEXIBLE;  Surgeon: SFranchot Gallo MD;  [facility]  Service: Urology;  Laterality: N/A;  no seeds found in bladder  . PROSTATE BIOPSY  10/10/11    Adenocarcinoma,gleason= 3+3=6,& 4+3=7,volume=75.8cc,Psa=8.29  . RADIOACTIVE SEED IMPLANT  03/19/2012   Procedure: RADIOACTIVE SEED IMPLANT;  Surgeon: SFranchot Gallo MD;  [facility]  Service: Urology;  Laterality: N/A;  70  seeds implanted  . TRANSURETHRAL RESECTION OF PROSTATE N/A 08/31/2012   Procedure: TRANSURETHRAL RESECTION OF THE PROSTATE WITH GYRUS INSTRUMENTS;  Surgeon: SFranchot Gallo MD;  [facility]  Service: Urology;  Laterality: N/A;   Social History   Tobacco Use  . Smoking status: Former Smoker    Packs/day: 0.25    Years: 25.00    Pack years: 6.25    Types: Cigarettes    Quit date: 2014    Years since quitting: 7.8  . Smokeless tobacco: Never Used  Substance Use Topics  . Alcohol use: Yes    Alcohol/week: 0.0 standard drinks    Comment: occasionally beer 1-2/day   Marital Status: Married   ROS  Review of Systems  Constitutional: Negative for malaise/fatigue and weight gain.  Cardiovascular: Negative for chest pain, claudication, dyspnea on exertion, leg swelling, near-syncope, orthopnea, palpitations, paroxysmal nocturnal dyspnea and syncope.  Respiratory: Negative for shortness of breath.   Hematologic/Lymphatic: Does not bruise/bleed easily.  Gastrointestinal: Negative for melena.  Neurological: Negative for dizziness and weakness.   Objective  Blood pressure 121/64, pulse 64, resp. rate 16, height '5\' 11"'  (1.803 m), weight 171 lb (77.6 kg), SpO2 97 %. Body mass index is 23.85 kg/m.   Vitals with BMI 02/08/2020 12/27/2019 06/10/2019  Height '5\' 11"'  '5\' 11"'  '5\' 11"'   Weight 171 lbs 175  lbs 174 lbs 10 oz  BMI 23.86 62.94 76.54  Systolic 650 354 656  Diastolic 64 71 90  Pulse 64 64 53     Physical Exam Vitals reviewed.  HENT:     Head: Normocephalic and atraumatic.  Cardiovascular:     Rate and Rhythm: Normal rate and regular rhythm.     Pulses: Normal pulses and intact distal pulses.     Heart sounds:  Normal heart sounds and S1 normal. No murmur heard.  No gallop.      Comments: (Paradoxical split in second heart sound) No leg edema, no JVD. Pulmonary:     Effort: Pulmonary effort is normal. No respiratory distress.     Breath sounds: Normal breath sounds. No wheezing, rhonchi or rales.  Abdominal:     General: Bowel sounds are normal.     Palpations: Abdomen is soft.  Musculoskeletal:     Right lower leg: No edema.     Left lower leg: No edema.  Neurological:     Mental Status: He is alert.    Laboratory examination:   Recent Labs    01/07/20 0834  NA 135  K 5.3*  CL 96  CO2 25  GLUCOSE 94  BUN 16  CREATININE 0.82  CALCIUM 9.2  GFRNONAA 90  GFRAA 104   CMP Latest Ref Rng & Units 01/07/2020 08/31/2012 04/21/2012  Glucose 65 - 99 mg/dL 94 96 99  BUN 8 - 27 mg/dL 16 23 25(H)  Creatinine 0.76 - 1.27 mg/dL 0.82 0.80 1.90(H)  Sodium 134 - 144 mmol/L 135 141 141  Potassium 3.5 - 5.2 mmol/L 5.3(H) 4.0 4.0  Chloride 96 - 106 mmol/L 96 109 109  CO2 20 - 29 mmol/L 25 - -  Calcium 8.6 - 10.2 mg/dL 9.2 - -  Total Protein 6.0 - 8.3 g/dL - - -  Total Bilirubin 0.3 - 1.2 mg/dL - - -  Alkaline Phos 39 - 117 U/L - - -  AST 0 - 37 U/L - - -  ALT 0 - 53 U/L - - -   CBC Latest Ref Rng & Units 10/10/2016 07/12/2016 04/24/2016  WBC 4.0 - 10.3 10e3/uL 5.1 5.1 5.4  Hemoglobin 13.0 - 17.1 g/dL 13.8 14.1 9.5(L)  Hematocrit 38 - 49 % 40.9 42.0 31.7(L)  Platelets 140 - 400 10e3/uL 218 222 254   Lipid Panel     Component Value Date/Time   CHOL 217 (H) 01/07/2020 0834   TRIG 70 01/07/2020 0834   HDL 71 01/07/2020 0834   LDLCALC 134 (H) 01/07/2020 0834   HEMOGLOBIN A1C No results found for: HGBA1C, MPG TSH Recent Labs    01/07/20 0834  TSH 3.710     External labs:  Cholesterol, total 236.000 11/10/2019 HDL 77.000 MG 11/10/2019 LDL-C 141.000 11/10/2019 Triglycerides 103.000 11/10/2019  Hemoglobin 13.800 10/10/2016  Creatinine, Serum 0.830 MG/ 11/10/2019 Potassium N/D ALT  (SGPT) 31.000 IU/ 11/10/2019   Labs 05/05/2019: Hb 15.2/HCT 45.0, platelets 219.  BUN 13, creatinine 1.2, EGFR  60 mL, sodium 144, potassium 5.7.  AST, ALT normal limits. Total cholesterol 245, triglycerides 101, HDL 82, LDL 146.  06/25/2018: Cholesterol 236, triglycerides 85, HDL 78, LDL 141.  Medications   Current Outpatient Medications on File Prior to Visit  Medication Sig Dispense Refill  . carvedilol (COREG) 6.25 MG tablet Take 1 tablet (6.25 mg total) by mouth 2 (two) times daily with a meal. 180 tablet 3  . co-enzyme Q-10 50 MG capsule Take 50 mg by mouth  daily.    . lisinopril-hydrochlorothiazide (ZESTORETIC) 20-25 MG tablet Take 1 tablet by mouth daily. 90 tablet 3  . Multiple Vitamin (MULTIVITAMIN) capsule Take 1 capsule by mouth daily.    . vitamin C (ASCORBIC ACID) 500 MG tablet Take 1,000 mg by mouth 2 (two) times daily.      No current facility-administered medications on file prior to visit.   Laboratory examination:  No results found.  Cardiac Studies:   Echo 2017: Mild concentric LVH with low normal decrease in global wall motion. Abnormal septal wall motion due to left bundle branch block. No wall motion abnormalities. Estimated LVEF of 45-50%. Trace mitral regurgitation.  Coronary angiogram 01/10/2015: Mid LAD lesion, 40% stenosed. Prox RCA lesion, 50% stenosed. 1.  Diffuse hypokinesis compatible with cardiomyopathy 2.  Coronary artery disease with moderate segmental mid LAD stenosis and proximal and mid right coronary artery stenosis  Echocardiogram 04/20/2019:  Left ventricle cavity is normal in size. Moderate concentric hypertrophy  of the left ventricle. Abnormal septal wall motion due to left bundle  branch block. Mildly depressed LV systolic function with EF 45%. Doppler  evidence of grade I (impaired) diastolic dysfunction, normal LAP. Left  atrial cavity is mildly dilated.  Trileaflet aortic valve. Trace aortic regurgitation.  Mild tricuspid  regurgitation. Estimated pulmonary artery systolic pressure  is 24 mmHg.  No significant change compared to previous study on 09/22/2017.   EKG   EKG 12/27/2019: Normal sinus rhythm at rate of 64 bpm, normal axis, poor R wave progression, cannot exclude anteroseptal infarct old.  No evidence of ischemia.    EKG 04/15/2019: Normal sinus rhythm at 64 bpm, left axis deviation, left bundle branch block, no further analysis due to LBBB  Assessment     ICD-10-CM   1. Primary hypertension  X41 Basic metabolic panel    Basic metabolic panel  2. Mixed hyperlipidemia  E78.2 atorvastatin (LIPITOR) 40 MG tablet    Lipid Panel With LDL/HDL Ratio    Lipid Panel With LDL/HDL Ratio  3. Atherosclerosis of native coronary artery of native heart without angina pectoris  I25.10 atorvastatin (LIPITOR) 40 MG tablet    Lipid Panel With LDL/HDL Ratio    Lipid Panel With LDL/HDL Ratio   Meds ordered this encounter  Medications  . atorvastatin (LIPITOR) 40 MG tablet    Sig: Take 1 tablet (40 mg total) by mouth daily.    Dispense:  30 tablet    Refill:  3   Medications Discontinued During This Encounter  Medication Reason  . rosuvastatin (CRESTOR) 20 MG tablet Change in therapy    Recommendations:   Thomas Grant  is a 69 y.o. Caucasian male with nonischemic cardiomyopathy with mild  LV systolic dysfunction, chronic LBBB, hypertension, hyperlipidemia, moderate coronary artery disease by coronary angiography and in 2016 with RCA 50% mid stenosis and LAD 34% stenosis, EF was around 40% at that time.  Patient presents for 4-week follow-up of hypertension and hyperlipidemia.  He is tolerating increased dose of lisinopril/HCT well, and blood pressure is well controlled.  Potassium remains borderline elevated, will recheck BMP in 1 month.  In regard to hyperlipidemia, patient has been taking Crestor 20 mg however upon recheck LDL only improved from 141 to 134.  Suspect he is a nonresponder to Crestor.  Will  stop Crestor and start Lipitor 40 mg daily.  We will recheck lipid profile testing in 1 month.  Follow up in 6 months.   Patient was seen in collaboration with Dr. Einar Gip. He  also reviewed patient's chart and Dr. Einar Gip is in agreement of the plan.    Alethia Berthold, PA-C 02/08/2020, 4:30 PM Office: [phone]

## 2020-02-29 ENCOUNTER — Other Ambulatory Visit: Payer: Self-pay | Admitting: Cardiology

## 2020-02-29 DIAGNOSIS — I10 Essential (primary) hypertension: Secondary | ICD-10-CM

## 2020-06-07 ENCOUNTER — Other Ambulatory Visit: Payer: Self-pay

## 2020-06-07 DIAGNOSIS — E782 Mixed hyperlipidemia: Secondary | ICD-10-CM

## 2020-06-07 DIAGNOSIS — I2510 Atherosclerotic heart disease of native coronary artery without angina pectoris: Secondary | ICD-10-CM

## 2020-06-07 MED ORDER — ATORVASTATIN CALCIUM 40 MG PO TABS: 40.0000 mg | ORAL_TABLET | Freq: Every day | ORAL | 3 refills | Status: DC

## 2020-08-11 ENCOUNTER — Ambulatory Visit: Payer: PPO | Admitting: Cardiology

## 2020-09-02 ENCOUNTER — Other Ambulatory Visit: Payer: Self-pay | Admitting: Cardiology

## 2020-09-02 DIAGNOSIS — I2510 Atherosclerotic heart disease of native coronary artery without angina pectoris: Secondary | ICD-10-CM

## 2020-09-02 DIAGNOSIS — E782 Mixed hyperlipidemia: Secondary | ICD-10-CM

## 2020-09-14 DIAGNOSIS — I10 Essential (primary) hypertension: Secondary | ICD-10-CM | POA: Diagnosis not present

## 2020-09-14 DIAGNOSIS — E7800 Pure hypercholesterolemia, unspecified: Secondary | ICD-10-CM | POA: Diagnosis not present

## 2020-09-15 DIAGNOSIS — E782 Mixed hyperlipidemia: Secondary | ICD-10-CM | POA: Diagnosis not present

## 2020-09-15 DIAGNOSIS — I10 Essential (primary) hypertension: Secondary | ICD-10-CM | POA: Diagnosis not present

## 2020-09-15 DIAGNOSIS — I2510 Atherosclerotic heart disease of native coronary artery without angina pectoris: Secondary | ICD-10-CM | POA: Diagnosis not present

## 2020-09-16 LAB — BASIC METABOLIC PANEL
BUN/Creatinine Ratio: 17 (ref 10–24)
BUN: 12 mg/dL (ref 8–27)
CO2: 23 mmol/L (ref 20–29)
Calcium: 9.4 mg/dL (ref 8.6–10.2)
Chloride: 96 mmol/L (ref 96–106)
Creatinine, Ser: 0.69 mg/dL — ABNORMAL LOW (ref 0.76–1.27)
Glucose: 89 mg/dL (ref 65–99)
Potassium: 4.2 mmol/L (ref 3.5–5.2)
Sodium: 135 mmol/L (ref 134–144)
eGFR: 99 mL/min/{1.73_m2} (ref >59)

## 2020-09-16 LAB — LIPID PANEL WITH LDL/HDL RATIO
Cholesterol, Total: 223 mg/dL — ABNORMAL HIGH (ref 100–199)
HDL: 70 mg/dL (ref >39)
LDL Chol Calc (NIH): 130 mg/dL — ABNORMAL HIGH (ref 0–99)
LDL/HDL Ratio: 1.9 ratio (ref 0.0–3.6)
Triglycerides: 131 mg/dL (ref 0–149)
VLDL Cholesterol Cal: 23 mg/dL (ref 5–40)

## 2020-09-20 ENCOUNTER — Ambulatory Visit: Payer: PPO | Admitting: Cardiology

## 2020-09-21 DIAGNOSIS — C61 Malignant neoplasm of prostate: Secondary | ICD-10-CM | POA: Diagnosis not present

## 2020-09-21 DIAGNOSIS — E7800 Pure hypercholesterolemia, unspecified: Secondary | ICD-10-CM | POA: Diagnosis not present

## 2020-09-21 DIAGNOSIS — I2510 Atherosclerotic heart disease of native coronary artery without angina pectoris: Secondary | ICD-10-CM | POA: Diagnosis not present

## 2020-09-21 DIAGNOSIS — R5383 Other fatigue: Secondary | ICD-10-CM | POA: Diagnosis not present

## 2020-09-21 DIAGNOSIS — Z0000 Encounter for general adult medical examination without abnormal findings: Secondary | ICD-10-CM | POA: Diagnosis not present

## 2020-09-21 DIAGNOSIS — I10 Essential (primary) hypertension: Secondary | ICD-10-CM | POA: Diagnosis not present

## 2020-09-21 DIAGNOSIS — Z125 Encounter for screening for malignant neoplasm of prostate: Secondary | ICD-10-CM | POA: Diagnosis not present

## 2020-10-06 ENCOUNTER — Encounter: Payer: Self-pay | Admitting: Cardiology

## 2020-10-06 ENCOUNTER — Ambulatory Visit: Payer: PPO | Admitting: Cardiology

## 2020-10-06 ENCOUNTER — Other Ambulatory Visit: Payer: Self-pay

## 2020-10-06 VITALS — BP 134/80 | HR 63 | Temp 98.5°F | Resp 16 | Ht 71.0 in | Wt 173.2 lb

## 2020-10-06 DIAGNOSIS — E782 Mixed hyperlipidemia: Secondary | ICD-10-CM

## 2020-10-06 DIAGNOSIS — I10 Essential (primary) hypertension: Secondary | ICD-10-CM

## 2020-10-06 DIAGNOSIS — I2510 Atherosclerotic heart disease of native coronary artery without angina pectoris: Secondary | ICD-10-CM | POA: Diagnosis not present

## 2020-10-06 DIAGNOSIS — I428 Other cardiomyopathies: Secondary | ICD-10-CM | POA: Diagnosis not present

## 2020-10-06 DIAGNOSIS — H0266 Xanthelasma of left eye, unspecified eyelid: Secondary | ICD-10-CM

## 2020-10-06 DIAGNOSIS — I447 Left bundle-branch block, unspecified: Secondary | ICD-10-CM | POA: Diagnosis not present

## 2020-10-06 MED ORDER — EZETIMIBE 10 MG PO TABS: 10.0000 mg | ORAL_TABLET | Freq: Every day | ORAL | 3 refills | Status: DC

## 2020-10-06 NOTE — Progress Notes (Signed)
Primary Physician/Referring:  Deland Pretty, MD  [name], male    DOB: 06-27-1950, 70 y.o.   MRN: [id number]  Chief Complaint  Patient presents with   Hypertension   Coronary Artery Disease   Hyperlipidemia   LBBB   Follow-up    6 months   HPI:    HPI: Thomas Grant  is a 70 y.o. Caucasian male with hypertension, hyperlipidemia, nonischemic cardiomyopathy with mild LV systolic dysfunction and moderate CAD by coronary angiogram in 2016 with Dr. Wynonia Lawman, and chronic LBBB, former tobacco use quit 2014, history of prostate cancer in 2013 s/p radiation therapy,.   Presents for 62-monthfollow-up of nonischemic cardiomyopathy hyperlipidemia and hypertension.  6 months ago lisinopril HCT was increased and Crestor was discontinued and switched to Lipitor as he appeared to be not responding to statins.  He now presents for 661-monthffice visit, continues remain asymptomatic and is tolerating all the medications.  He has been compliant with medication use.     Past Medical History:  Diagnosis Date   Atherosclerosis    BPH (benign prostatic hypertrophy) with urinary retention    History of radiation therapy 01/21/2012- 11/25   Prostate 45Gy/2565fof 1.8Gy   HTN (hypertension)    not actively treated   Hypercholesterolemia    Left bundle branch block (LBBB)    [place]  s/p radioactive seed implants 03-19-2012   Adenocarcinoma,gleason=3+3=6,& 4+3=7,volume=75.8cc,Psa=8.29   Self-catheterizes urinary bladder    3 to 4 times daily   Past Surgical History:  Procedure Laterality Date   CARDIAC CATHETERIZATION N/A 01/10/2015   Procedure: Left Heart Cath and Coronary Angiography;  Surgeon: WilJacolyn ReedyD;  [facility];  Service: Cardiovascular;  Laterality: N/A;   CYSTOSCOPY  03/19/2012   Procedure: CYSTOSCOPY FLEXIBLE;  Surgeon: SteFranchot GalloD;  [facility];  Laterality:  N/A;  no seeds found in bladder   PROSTATE BIOPSY  10/10/11   Adenocarcinoma,gleason= 3+3=6,& 4+3=7,volume=75.8cc,Psa=8.29   RADIOACTIVE SEED IMPLANT  03/19/2012   Procedure: RADIOACTIVE SEED IMPLANT;  Surgeon: SteFranchot GalloD;  [facility];  Laterality: N/A;  70  seeds implanted   TRANSURETHRAL RESECTION OF PROSTATE N/A 08/31/2012   Procedure: TRANSURETHRAL RESECTION OF THE PROSTATE WITH GYRUS INSTRUMENTS;  Surgeon: SteFranchot GalloD;  [facility];  Laterality: N/A;   Social History   Tobacco Use   Smoking status: Former    Packs/day: 0.25    Years: 25.00    Pack years: 6.25    Types: Cigarettes    Quit date: 2014    Years since quitting: 8.5   Smokeless tobacco: Never  Substance Use Topics   Alcohol use: Yes    Alcohol/week: 0.0 standard drinks    Comment: occasionally beer 1-2/day   Marital Status: Married   ROS  Review of Systems  Cardiovascular:  Negative for chest pain, dyspnea on exertion and leg swelling.  Gastrointestinal:  Negative for melena.  Objective  Blood pressure 134/80, pulse 63, temperature 98.5 F (36.9 C), temperature source Temporal, resp. rate 16, height 5' 11" (1.803 m), weight 173 lb 3.2 oz (78.6 kg), SpO2 98 %. Body mass index is 24.16 kg/m.   Vitals with BMI 10/06/2020 02/08/2020 12/27/2019  Height 5' 11" 5' 11" 5' 11"  Weight 173 lbs 3 oz 171 lbs 175 lbs  BMI 24.17 23.70.62.37.62ystolic [id number]  Diastolic 80 64 71  Pulse 63 64 64     Physical Exam Vitals reviewed.  HENT:     Head: Normocephalic and atraumatic.  Neck:     Vascular: No carotid bruit or JVD.  Cardiovascular:     Rate and Rhythm: Normal rate and regular rhythm.     Pulses: Normal pulses and intact distal pulses.     Heart sounds: Normal heart sounds and S1 normal. No murmur heard.   No gallop.     Comments: Paradoxical split of second heart sound. Pulmonary:     Effort:  Pulmonary effort is normal. No respiratory distress.     Breath sounds: Normal breath sounds. No wheezing, rhonchi or rales.  Abdominal:     General: Bowel sounds are normal.     Palpations: Abdomen is soft.  Musculoskeletal:     Right lower leg: No edema.     Left lower leg: No edema.  Skin:    Comments: Xanthelasma left eyelid  Neurological:     Mental Status: He is alert.   Laboratory examination:   Recent Labs    01/07/20 0834 09/15/20 0936  NA 135 135  K 5.3* 4.2  CL 96 96  CO2 25 23  GLUCOSE 94 89  BUN 16 12  CREATININE 0.82 0.69*  CALCIUM 9.2 9.4  GFRNONAA 90  --   GFRAA 104  --    CMP Latest Ref Rng & Units 09/15/2020 01/07/2020 08/31/2012  Glucose 65 - 99 mg/dL 89 94 96  BUN 8 - 27 mg/dL _0 Creatinine 0.76 - 1.27 mg/dL 0.69(L) 0.82 0.80  Sodium 134 - 144 mmol/L 135 135 141  Potassium 3.5 - 5.2 mmol/L 4.2 5.3(H) 4.0  Chloride 96 - 106 mmol/L 96 96 109  CO2 20 - 29 mmol/L 23 25 -  Calcium 8.6 - 10.2 mg/dL 9.4 9.2 -  Total Protein 6.0 - 8.3 g/dL - - -  Total Bilirubin 0.3 - 1.2 mg/dL - - -  Alkaline Phos 39 - 117 U/L - - -  AST 0 - 37 U/L - - -  ALT 0 - 53 U/L - - -   CBC Latest Ref Rng & Units 10/10/2016 07/12/2016 04/24/2016  WBC 4.0 - 10.3 10e3/uL 5.1 5.1 5.4  Hemoglobin 13.0 - 17.1 g/dL 13.8 14.1 9.5(L)  Hematocrit 38.4 - 49.9 % 40.9 42.0 31.7(L)  Platelets 140 - 400 10e3/uL 218 222 254   Lipid Panel     Component Value Date/Time   CHOL 223 (H) 09/15/2020 0935   TRIG 131 09/15/2020 0935   HDL 70 09/15/2020 0935   LDLCALC 130 (H) 09/15/2020 0935   HEMOGLOBIN A1C No results found for: HGBA1C, MPG TSH Recent Labs    01/07/20 0834  TSH 3.710    External labs:  Cholesterol, total 236.000 11/10/2019 HDL 77.000 MG 11/10/2019 LDL-C 141.000 11/10/2019 Triglycerides 103.000 11/10/2019  Hemoglobin 13.800 10/10/2016  Creatinine, Serum 0.830 MG/ 11/10/2019 Potassium N/D ALT (SGPT) 31.000 IU/ 11/10/2019   Labs 05/05/2019: Hb 15.2/HCT 45.0,  platelets 219.  BUN 13, creatinine 1.2, EGFR  60 mL, sodium 144, potassium 5.7.  AST, ALT normal limits. Total cholesterol 245, triglycerides 101, HDL 82, LDL 146.  06/25/2018: Cholesterol 236, triglycerides 85, HDL 78, LDL 141.  Medications   Current Outpatient Medications on File Prior to Visit  Medication Sig Dispense Refill   atorvastatin (LIPITOR) 40 MG tablet TAKE ONE TABLET BY MOUTH DAILY 90 tablet 3   carvedilol (COREG) 6.25 MG tablet  Take 1 tablet (6.25 mg total) by mouth 2 (two) times daily with a meal. 180 tablet 3   co-enzyme Q-10 50 MG capsule Take 50 mg by mouth daily.     lisinopril-hydrochlorothiazide (ZESTORETIC) 20-25 MG tablet Take 1 tablet by mouth daily. 90 tablet 3   Multiple Vitamin (MULTIVITAMIN) capsule Take 1 capsule by mouth daily.     vitamin C (ASCORBIC ACID) 500 MG tablet Take 1,000 mg by mouth 2 (two) times daily.      No current facility-administered medications on file prior to visit.   Medications after present encounter:  Current Outpatient Medications  Medication Instructions   atorvastatin (LIPITOR) 40 MG tablet TAKE ONE TABLET BY MOUTH DAILY   carvedilol (COREG) 6.25 mg, Oral, 2 times daily with meals   co-enzyme Q-10 50 mg, Oral, Daily   ezetimibe (ZETIA) 10 mg, Oral, Daily after supper   lisinopril-hydrochlorothiazide (ZESTORETIC) 20-25 MG tablet 1 tablet, Oral, Daily   Multiple Vitamin (MULTIVITAMIN) capsule 1 capsule, Oral, Daily   vitamin C (ASCORBIC ACID) 1,000 mg, Oral, 2 times daily    Laboratory examination:  No results found.  Cardiac Studies:   Echo 2017: Mild concentric LVH with low normal decrease in global wall motion. Abnormal septal wall motion due to left bundle branch block. No wall motion abnormalities. Estimated LVEF of 45-50%. Trace mitral regurgitation.  Coronary angiogram 01/10/2015: Mid LAD lesion, 40% stenosed. Prox RCA lesion, 50% stenosed. 1.  Diffuse hypokinesis compatible with cardiomyopathy 2.  Coronary artery  disease with moderate segmental mid LAD stenosis and proximal and mid right coronary artery stenosis  Echocardiogram 04/20/2019:  Left ventricle cavity is normal in size. Moderate concentric hypertrophy  of the left ventricle. Abnormal septal wall motion due to left bundle  branch block. Mildly depressed LV systolic function with EF 45%. Doppler  evidence of grade I (impaired) diastolic dysfunction, normal LAP. Left  atrial cavity is mildly dilated.  Trileaflet aortic valve.  Trace aortic regurgitation.  Mild tricuspid regurgitation. Estimated pulmonary artery systolic pressure  is 24 mmHg.  No significant change compared to previous study on 09/22/2017.   EKG   EKG 10/06/2020: Normal sinus rhythm at rate of 61 bpm, left bundle branch block.  No further analysis.  No change from prior EKG 12/27/2019, EKG 04/15/2019  Assessment     ICD-10-CM   1. Non-ischemic cardiomyopathy (HCC)  I42.8     2. LBBB (left bundle branch block)  I44.7 EKG 12-Lead    3. Primary hypertension  I10     4. Mixed hyperlipidemia  E78.2 ezetimibe (ZETIA) 10 MG tablet    LDL cholesterol, direct    Lipid Panel With LDL/HDL Ratio    5. Xanthelasma of left eyelid  H02.66      Meds ordered this encounter  Medications   ezetimibe (ZETIA) 10 MG tablet    Sig: Take 1 tablet (10 mg total) by mouth daily after supper.    Dispense:  90 tablet    Refill:  3    There are no discontinued medications.   Recommendations:   SHAHIEM BEDWELL  is a 70 y.o. Caucasian male with hypertension, hyperlipidemia, nonischemic cardiomyopathy with mild LV systolic dysfunction and moderate CAD by coronary angiogram in 2016 with Dr. Wynonia Lawman, and chronic LBBB, former tobacco use quit 2014, history of prostate cancer in 2013 s/p radiation therapy,.   Presents for 65-monthfollow-up of nonischemic cardiomyopathy hyperlipidemia and hypertension.  He remains asymptomatic and no change in physical exam from prior visit.  He has been  compliant with all his medications.  With regard to hyperlipidemia, will add Zetia 10 mg daily to see if he can get his LDL down to 70 mg.  I will check lipids in 4 to 6 weeks.  He does have xanthelasma left eyelid.  Otherwise he remains well without clinical evidence of heart failure, LVEF has remained stable around 45%, no chest pain or dyspnea.  Blood pressure is now well controlled.  I will see him back on an annual basis.   Adrian Prows, MD, [facility] 10/06/2020, 1:32 PM Office: [phone] Fax: [phone] Pager: [phone]

## 2020-11-03 ENCOUNTER — Other Ambulatory Visit: Payer: Self-pay | Admitting: Cardiology

## 2020-11-03 DIAGNOSIS — I10 Essential (primary) hypertension: Secondary | ICD-10-CM

## 2020-11-25 ENCOUNTER — Encounter (HOSPITAL_COMMUNITY): Payer: Self-pay | Admitting: Emergency Medicine

## 2020-11-25 ENCOUNTER — Other Ambulatory Visit: Payer: Self-pay

## 2020-11-25 ENCOUNTER — Ambulatory Visit (HOSPITAL_COMMUNITY)
Admission: EM | Admit: 2020-11-25 | Discharge: 2020-11-25 | Disposition: A | Discharge status: Home / Self Care | Payer: PPO | Attending: Physician Assistant | Admitting: Physician Assistant

## 2020-11-25 DIAGNOSIS — S61011A Laceration without foreign body of right thumb without damage to nail, initial encounter: Secondary | ICD-10-CM

## 2020-11-25 DIAGNOSIS — Z23 Encounter for immunization: Secondary | ICD-10-CM

## 2020-11-25 MED ORDER — TETANUS-DIPHTH-ACELL PERTUSSIS 5-2.5-18.5 LF-MCG/0.5 IM SUSY
PREFILLED_SYRINGE | INTRAMUSCULAR | Status: AC
  Filled 2020-11-25: qty 0.5

## 2020-11-25 MED ORDER — DOXYCYCLINE HYCLATE 100 MG PO CAPS: 100.0000 mg | ORAL_CAPSULE | Freq: Two times a day (BID) | ORAL | 0 refills | Status: DC

## 2020-11-25 MED ORDER — TETANUS-DIPHTH-ACELL PERTUSSIS 5-2.5-18.5 LF-MCG/0.5 IM SUSY
0.5000 mL | PREFILLED_SYRINGE | Freq: Once | INTRAMUSCULAR | Status: AC
  Administered 2020-11-25: 0.5 mL via INTRAMUSCULAR

## 2020-11-25 MED ORDER — LIDOCAINE HCL 2 % IJ SOLN
INTRAMUSCULAR | Status: AC
  Filled 2020-11-25: qty 20

## 2020-11-25 NOTE — ED Provider Notes (Signed)
[place]    CSN: [id number] Arrival date & time: 11/25/20  1446      History   Chief Complaint Chief Complaint  Patient presents with   Laceration    HPI Thomas Grant is a 70 y.o. male.   Patient presents today with a several hour history of laceration to right thumb.  Reports that he spring-loaded joint on the stove he was working on caught his thumb causing laceration.  He is unsure when his last tetanus was.  Reports pain is rated 5/6 on a 0-10 pain scale, localized to affected area, described as throbbing, worse with pressure, no alleviating factors identified.  He has cleaned affected area and applied pressure.  He does not take any blood thinning medications.  Reports he is able to move thumb without difficulty and denies any numbness or paresthesias.  He is right-handed.   Past Medical History:  Diagnosis Date   Atherosclerosis    BPH (benign prostatic hypertrophy) with urinary retention    History of radiation therapy 01/21/2012- 11/25   Prostate 45Gy/67fs,of 1.8Gy   HTN (hypertension)    not actively treated   Hypercholesterolemia    Left bundle branch block (LBBB)    [place] ([facility] 10/[place] bx---  s/p radioactive seed implants 03-19-2012   Adenocarcinoma,gleason=3+3=6,& 4+3=7,volume=75.8cc,Psa=8.29   Self-catheterizes urinary bladder    3 to 4 times daily    Patient Active Problem List   Diagnosis Date Noted   Iron deficiency anemia 04/24/2016   Idiopathic cardiomyopathy (HCC)    LBBB (left bundle branch block)    Prostate cancer ([facility]    Hypercholesterolemia    Atherosclerosis    HTN (hypertension)    Malignant neoplasm of prostate ([facility] 11/18/2011    Past Surgical History:  Procedure Laterality Date   CARDIAC CATHETERIZATION N/A 01/10/2015   Procedure: Left Heart Cath and Coronary Angiography;  Surgeon: WJacolyn Reedy MD;  [facility];  Service: Cardiovascular;  Laterality: N/A;   CYSTOSCOPY  03/19/2012    Procedure: CYSTOSCOPY FLEXIBLE;  Surgeon: SFranchot Gallo MD;  [facility]  Service: Urology;  Laterality: N/A;  no seeds found in bladder   PROSTATE BIOPSY  10/10/11   Adenocarcinoma,gleason= 3+3=6,& 4+3=7,volume=75.8cc,Psa=8.29   RADIOACTIVE SEED IMPLANT  03/19/2012   Procedure: RADIOACTIVE SEED IMPLANT;  Surgeon: SFranchot Gallo MD;  [facility]  Service: Urology;  Laterality: N/A;  70  seeds implanted   TRANSURETHRAL RESECTION OF PROSTATE N/A 08/31/2012   Procedure: TRANSURETHRAL RESECTION OF THE PROSTATE WITH GYRUS INSTRUMENTS;  Surgeon: SFranchot Gallo MD;  [facility]  Service: Urology;  Laterality: N/A;       Home Medications    Prior to Admission medications   Medication Sig Start Date End Date Taking? Authorizing Provider  doxycycline (VIBRAMYCIN) 100 MG capsule Take 1 capsule (100 mg total) by mouth 2 (two) times daily. 11/25/20  Yes Yesennia Hirota K, PA-C  atorvastatin (LIPITOR) 40 MG tablet TAKE ONE TABLET BY MOUTH DAILY 09/04/20   GAdrian Prows MD  carvedilol (COREG) 6.25 MG tablet Take 1 tablet (6.25 mg total) by mouth 2 (two) times daily with a meal. 12/27/19   GAdrian Prows MD  co-enzyme Q-10 50 MG capsule Take 50 mg by mouth daily.    [provider]  ezetimibe (ZETIA) 10 MG tablet Take 1 tablet (10 mg total) by mouth daily after supper. 10/06/20 10/01/21  GAdrian Prows MD  lisinopril-hydrochlorothiazide (ZESTORETIC) 20-25 MG tablet TAKE  ONE TABLET BY MOUTH DAILY 11/03/20   Adrian Prows, MD  Multiple Vitamin (MULTIVITAMIN) capsule Take 1 capsule by mouth daily.    [provider]  vitamin C (ASCORBIC ACID) 500 MG tablet Take 1,000 mg by mouth 2 (two) times daily.     [provider]    Family History Family History  Problem Relation Age of Onset   Alzheimer's disease Father        deceased age 19   Diabetes type II Mother        Lupus alive at age 44   Prostate cancer Brother  63       seed implant    Social History Social History   Tobacco Use   Smoking status: Former    Packs/day: 0.25    Years: 25.00    Pack years: 6.25    Types: Cigarettes    Quit date: 2014    Years since quitting: 8.6   Smokeless tobacco: Never  Vaping Use   Vaping Use: Never used  Substance Use Topics   Alcohol use: Yes    Alcohol/week: 0.0 standard drinks    Comment: occasionally beer 1-2/day   Drug use: No     Allergies   Patient has no known allergies.   Review of Systems Review of Systems  Constitutional:  Positive for activity change. Negative for appetite change, fatigue and fever.  Respiratory:  Negative for cough and shortness of breath.   Gastrointestinal:  Negative for abdominal pain, diarrhea, nausea and vomiting.  Musculoskeletal:  Negative for arthralgias and myalgias.  Skin:  Positive for wound.  Neurological:  Negative for dizziness, weakness, light-headedness, numbness and headaches.    Physical Exam Triage Vital Signs ED Triage Vitals  Enc Vitals Group     BP 11/25/20 1557 (!) 152/63     Pulse Rate 11/25/20 1557 63     Resp 11/25/20 1557 18     Temp 11/25/20 1557 97.9 F (36.6 C)     Temp Source 11/25/20 1557 Oral     SpO2 11/25/20 1557 98 %     Weight --      Height --      Head Circumference --      Peak Flow --      Pain Score 11/25/20 1554 0     Pain Loc --      Pain Edu? --      Excl. in [place]? --    No data found.  Updated Vital Signs BP (!) 152/63 (BP Location: Left Arm)   Pulse 63   Temp 97.9 F (36.6 C) (Oral)   Resp 18   SpO2 98%   Visual Acuity Right Eye Distance:   Left Eye Distance:   Bilateral Distance:    Right Eye Near:   Left Eye Near:    Bilateral Near:     Physical Exam Vitals reviewed.  Constitutional:      General: He is awake.     Appearance: Normal appearance. He is normal weight. He is not ill-appearing.     Comments: Very pleasant male appears stated age in no acute distress  HENT:     Head:  Normocephalic and atraumatic.     Mouth/Throat:     Pharynx: No oropharyngeal exudate, posterior oropharyngeal erythema or uvula swelling.  Cardiovascular:     Rate and Rhythm: Normal rate and regular rhythm.     Heart sounds: Normal heart sounds, S1 normal and S2 normal. No murmur heard.  Comments: Brisk capillary refill in right thumb Pulmonary:     Effort: Pulmonary effort is normal.     Breath sounds: Normal breath sounds. No stridor. No wheezing, rhonchi or rales.  Skin:    Findings: Laceration present.     Comments: 3 cm jagged laceration noted lateral right thumb with 1 cm skin avulsion at lateral edge.  Neurological:     Mental Status: He is alert.  Psychiatric:        Behavior: Behavior is cooperative.     UC Treatments / Results  Labs (all labs ordered are listed, but only abnormal results are displayed) Labs Reviewed - No data to display  EKG   Radiology No results found.  Procedures Laceration Repair  Date/Time: 11/25/2020 5:49 PM Performed by: Terrilee Croak, PA-C Authorized by: Terrilee Croak, PA-C   Consent:    Consent obtained:  Verbal   Consent given by:  Patient   Risks, benefits, and alternatives were discussed: yes     Risks discussed:  Infection, pain, poor cosmetic result, need for additional repair and poor wound healing   Alternatives discussed:  Observation and referral Universal protocol:    Procedure explained and questions answered to patient or proxy's satisfaction: yes     Patient identity confirmed:  Verbally with patient Anesthesia:    Anesthesia method:  Nerve block   Block location:  Radial nerve   Block needle gauge:  27 G   Block anesthetic:  Lidocaine 2% w/o epi   Block injection procedure:  Anatomic landmarks identified, incremental injection, introduced needle and negative aspiration for blood   Block outcome:  Anesthesia achieved Laceration details:    Location:  Hand   Hand location:  R palm   Length (cm):   5 Pre-procedure details:    Preparation:  Patient was prepped and draped in usual sterile fashion Exploration:    Hemostasis achieved with:  Direct pressure   Imaging outcome: foreign body not noted   Treatment:    Area cleansed with:  Saline and chlorhexidine   Amount of cleaning:  Standard   Irrigation solution:  Sterile saline   Irrigation volume:  10 mL   Irrigation method:  Syringe   Visualized foreign bodies/material removed: no     Debridement:  None Skin repair:    Repair method:  Sutures and tissue adhesive   Suture size:  6-0   Suture material:  Prolene   Suture technique:  Simple interrupted   Number of sutures:  5 Approximation:    Approximation:  Close Repair type:    Repair type:  Intermediate Post-procedure details:    Dressing:  Non-adherent dressing   Procedure completion:  Tolerated (including critical care time)  Medications Ordered in UC Medications  Tdap (BOOSTRIX) injection 0.5 mL (0.5 mLs Intramuscular Given 11/25/20 1703)    Initial Impression / Assessment and Plan / UC Course  I have reviewed the triage vital signs and the nursing notes.  Pertinent labs & imaging results that were available during my care of the patient were reviewed by me and considered in my medical decision making (see chart for details).      Unfortunately, wound repair was complicated by several areas of avulsed tissue that did not allow for close approximation.  A total of 5 simple interrupted sutures were placed where approximation was possible and tissue adhesive was applied to large areas of tissue avulsion.  Area was wrapped and patient was placed in splint.  Discussed that he would  need to have sutures removed in approximately 10 days.  If tissue adhesive is interfering with suture removal they can use antibiotic ointment to loosen this and allow this to come off.  He was given contact information for hand specialist should symptoms persist or worsen.  Tetanus was updated  today.  Given contaminated nature of wound patient was started on doxycycline twice daily.  Discussed alarm symptoms that warrant emergent evaluation.  Strict return precautions given to which patient expressed understanding.  Final Clinical Impressions(s) / UC Diagnoses   Final diagnoses:  Laceration of right thumb without foreign body without damage to nail, initial encounter     Discharge Instructions      Please keep area clean.  Take doxycycline twice daily to cover for any current infection since injury was somewhat contaminated.  If you have any difficulty moving the finger or develop any numbness you should see a hand surgeon as we discussed.  I would recommend that you have sutures removed by our clinic assuming normal healing processes in about 10 days.  If you have any worsening symptoms with return for reevaluation as we discussed.     ED Prescriptions     Medication Sig Dispense Auth. Provider   doxycycline (VIBRAMYCIN) 100 MG capsule Take 1 capsule (100 mg total) by mouth 2 (two) times daily. 20 capsule Juanya Villavicencio, Derry Skill, PA-C      PDMP not reviewed this encounter.   Terrilee Croak, PA-C 11/25/20 1753

## 2020-11-25 NOTE — ED Triage Notes (Signed)
Patient injured right thumb today while working on a stove.  Reports the edge of a hinge cut thumb.  Last tetanus is unknown

## 2020-11-25 NOTE — Discharge Instructions (Addendum)
Please keep area clean.  Take doxycycline twice daily to cover for any current infection since injury was somewhat contaminated.  If you have any difficulty moving the finger or develop any numbness you should see a hand surgeon as we discussed.  I would recommend that you have sutures removed by our clinic assuming normal healing processes in about 10 days.  If you have any worsening symptoms with return for reevaluation as we discussed.

## 2020-12-02 ENCOUNTER — Other Ambulatory Visit: Payer: Self-pay | Admitting: Cardiology

## 2020-12-02 DIAGNOSIS — I2510 Atherosclerotic heart disease of native coronary artery without angina pectoris: Secondary | ICD-10-CM

## 2020-12-05 ENCOUNTER — Other Ambulatory Visit: Payer: Self-pay

## 2020-12-05 ENCOUNTER — Ambulatory Visit (HOSPITAL_COMMUNITY)
Admission: RE | Admit: 2020-12-05 | Discharge: 2020-12-05 | Disposition: A | Discharge status: Home / Self Care | Payer: PPO | Source: Ambulatory Visit | Attending: Internal Medicine | Admitting: Internal Medicine

## 2020-12-05 DIAGNOSIS — Z4802 Encounter for removal of sutures: Secondary | ICD-10-CM

## 2020-12-05 NOTE — ED Notes (Signed)
Requested fowler, np look at sutured wound prior to suture removal.  Provider agreed with suter removal as planned

## 2020-12-05 NOTE — Discharge Instructions (Signed)
Follow up as needed

## 2020-12-05 NOTE — ED Triage Notes (Signed)
Patient had sutures placed on 8/27 to right palm.  Patient is in department today for suture removal

## 2021-01-01 DIAGNOSIS — D224 Melanocytic nevi of scalp and neck: Secondary | ICD-10-CM | POA: Diagnosis not present

## 2021-01-01 DIAGNOSIS — L308 Other specified dermatitis: Secondary | ICD-10-CM | POA: Diagnosis not present

## 2021-01-01 DIAGNOSIS — D2271 Melanocytic nevi of right lower limb, including hip: Secondary | ICD-10-CM | POA: Diagnosis not present

## 2021-01-01 DIAGNOSIS — D2239 Melanocytic nevi of other parts of face: Secondary | ICD-10-CM | POA: Diagnosis not present

## 2021-01-01 DIAGNOSIS — L821 Other seborrheic keratosis: Secondary | ICD-10-CM | POA: Diagnosis not present

## 2021-01-01 DIAGNOSIS — D2272 Melanocytic nevi of left lower limb, including hip: Secondary | ICD-10-CM | POA: Diagnosis not present

## 2021-01-01 DIAGNOSIS — L814 Other melanin hyperpigmentation: Secondary | ICD-10-CM | POA: Diagnosis not present

## 2021-01-01 DIAGNOSIS — D225 Melanocytic nevi of trunk: Secondary | ICD-10-CM | POA: Diagnosis not present

## 2021-01-12 DIAGNOSIS — Z23 Encounter for immunization: Secondary | ICD-10-CM | POA: Diagnosis not present

## 2021-03-08 DIAGNOSIS — E7800 Pure hypercholesterolemia, unspecified: Secondary | ICD-10-CM | POA: Diagnosis not present

## 2021-03-15 DIAGNOSIS — I420 Dilated cardiomyopathy: Secondary | ICD-10-CM | POA: Diagnosis not present

## 2021-03-15 DIAGNOSIS — E7800 Pure hypercholesterolemia, unspecified: Secondary | ICD-10-CM | POA: Diagnosis not present

## 2021-03-15 DIAGNOSIS — Z87891 Personal history of nicotine dependence: Secondary | ICD-10-CM | POA: Diagnosis not present

## 2021-03-15 DIAGNOSIS — I10 Essential (primary) hypertension: Secondary | ICD-10-CM | POA: Diagnosis not present

## 2021-03-20 NOTE — Progress Notes (Signed)
Labs 03/08/2021:  Total cholesterol 209, triglycerides 155, HDL 71, LDL 107, non-HDL cholesterol 138.  BUN 13, creatinine 0.71, potassium 4.1, EGFR 94 mL, CMP otherwise normal.  PSA normal, free testosterone mildly reduced at 1.55 (1.60-2.90%).  TSH normal.  Vitamin D3 normal.  Hb 14.5/HCT 42.9, platelets 235, normal indicis.

## 2021-05-14 ENCOUNTER — Other Ambulatory Visit: Payer: Self-pay | Admitting: Registered Nurse

## 2021-05-14 DIAGNOSIS — Z87891 Personal history of nicotine dependence: Secondary | ICD-10-CM

## 2021-06-06 ENCOUNTER — Ambulatory Visit
Admission: RE | Admit: 2021-06-06 | Discharge: 2021-06-06 | Disposition: A | Discharge status: Home / Self Care | Payer: PPO | Source: Ambulatory Visit | Attending: Registered Nurse | Admitting: Registered Nurse

## 2021-06-06 DIAGNOSIS — H5319 Other subjective visual disturbances: Secondary | ICD-10-CM | POA: Diagnosis not present

## 2021-06-06 DIAGNOSIS — Z87891 Personal history of nicotine dependence: Secondary | ICD-10-CM

## 2021-06-06 DIAGNOSIS — D3500 Benign neoplasm of unspecified adrenal gland: Secondary | ICD-10-CM | POA: Diagnosis not present

## 2021-06-06 DIAGNOSIS — H43813 Vitreous degeneration, bilateral: Secondary | ICD-10-CM | POA: Diagnosis not present

## 2021-06-06 DIAGNOSIS — Z961 Presence of intraocular lens: Secondary | ICD-10-CM | POA: Diagnosis not present

## 2021-06-06 DIAGNOSIS — I2510 Atherosclerotic heart disease of native coronary artery without angina pectoris: Secondary | ICD-10-CM | POA: Diagnosis not present

## 2021-06-06 DIAGNOSIS — Z8546 Personal history of malignant neoplasm of prostate: Secondary | ICD-10-CM | POA: Diagnosis not present

## 2021-09-05 ENCOUNTER — Other Ambulatory Visit: Payer: Self-pay | Admitting: Cardiology

## 2021-09-05 DIAGNOSIS — I2510 Atherosclerotic heart disease of native coronary artery without angina pectoris: Secondary | ICD-10-CM

## 2021-09-05 DIAGNOSIS — E782 Mixed hyperlipidemia: Secondary | ICD-10-CM

## 2021-09-12 DIAGNOSIS — E7800 Pure hypercholesterolemia, unspecified: Secondary | ICD-10-CM | POA: Diagnosis not present

## 2021-09-12 DIAGNOSIS — I10 Essential (primary) hypertension: Secondary | ICD-10-CM | POA: Diagnosis not present

## 2021-09-12 DIAGNOSIS — C61 Malignant neoplasm of prostate: Secondary | ICD-10-CM | POA: Diagnosis not present

## 2021-09-12 DIAGNOSIS — Z0000 Encounter for general adult medical examination without abnormal findings: Secondary | ICD-10-CM | POA: Diagnosis not present

## 2021-09-12 DIAGNOSIS — Z125 Encounter for screening for malignant neoplasm of prostate: Secondary | ICD-10-CM | POA: Diagnosis not present

## 2021-09-19 DIAGNOSIS — E7800 Pure hypercholesterolemia, unspecified: Secondary | ICD-10-CM | POA: Diagnosis not present

## 2021-09-19 DIAGNOSIS — I10 Essential (primary) hypertension: Secondary | ICD-10-CM | POA: Diagnosis not present

## 2021-09-19 DIAGNOSIS — Z23 Encounter for immunization: Secondary | ICD-10-CM | POA: Diagnosis not present

## 2021-09-19 DIAGNOSIS — Z0000 Encounter for general adult medical examination without abnormal findings: Secondary | ICD-10-CM | POA: Diagnosis not present

## 2021-09-19 DIAGNOSIS — I2510 Atherosclerotic heart disease of native coronary artery without angina pectoris: Secondary | ICD-10-CM | POA: Diagnosis not present

## 2021-09-19 NOTE — Progress Notes (Signed)
Labs 09/12/2021:  Hb 14.0/HCT 40.5, platelet 236, normal indicis.  BUN 16, creatinine 0.75, EGFR >60 mL, potassium 4.0, LFTs normal.  Total cholesterol 204, triglycerides 144, HDL 67, LDL 108.  Non-HDL cholesterol 137.

## 2021-09-27 ENCOUNTER — Other Ambulatory Visit: Payer: Self-pay | Admitting: Cardiology

## 2021-09-27 DIAGNOSIS — E782 Mixed hyperlipidemia: Secondary | ICD-10-CM

## 2021-10-08 ENCOUNTER — Encounter: Payer: Self-pay | Admitting: Cardiology

## 2021-10-08 ENCOUNTER — Ambulatory Visit: Payer: PPO | Admitting: Cardiology

## 2021-10-08 VITALS — BP 132/75 | HR 61 | Temp 98.2°F | Resp 16 | Ht 71.0 in | Wt 175.0 lb

## 2021-10-08 DIAGNOSIS — I447 Left bundle-branch block, unspecified: Secondary | ICD-10-CM | POA: Diagnosis not present

## 2021-10-08 DIAGNOSIS — I428 Other cardiomyopathies: Secondary | ICD-10-CM | POA: Diagnosis not present

## 2021-10-08 DIAGNOSIS — I10 Essential (primary) hypertension: Secondary | ICD-10-CM

## 2021-10-08 DIAGNOSIS — E782 Mixed hyperlipidemia: Secondary | ICD-10-CM

## 2021-10-08 MED ORDER — ROSUVASTATIN CALCIUM 20 MG PO TABS: 20.0000 mg | ORAL_TABLET | Freq: Every day | ORAL | 2 refills | Status: DC

## 2021-10-08 NOTE — Progress Notes (Signed)
Primary Physician/Referring:  Deland Pretty, MD  [name], male    DOB: [birth year], 71 y.o.   MRN: [id number]  Chief Complaint  Patient presents with   Coronary Artery Disease   Hypertension   Hyperlipidemia   Follow-up    1 year   HPI:    HPI: Thomas Grant  is a 71 y.o. Caucasian male with hypertension, hyperlipidemia, nonischemic cardiomyopathy with mild LV systolic dysfunction and moderate CAD by coronary angiogram in 2016 and chronic LBBB, former tobacco use quit 2014, history of prostate cancer in 2013 s/p radiation therapy,.   Patient presents for annual visit, essentially remains asymptomatic.     Past Medical History:  Diagnosis Date   Atherosclerosis    BPH (benign prostatic hypertrophy) with urinary retention    History of radiation therapy 01/21/2012- 11/25   Prostate 45Gy/55fs,of 1.8Gy   HTN (hypertension)    not actively treated   Hypercholesterolemia    Left bundle branch block (LBBB)    [place]---  s/p radioactive seed implants 03-19-2012   Adenocarcinoma,gleason=3+3=6,& 4+3=7,volume=75.8cc,Psa=8.29   Self-catheterizes urinary bladder    3 to 4 times daily   Past Surgical History:  Procedure Laterality Date   CARDIAC CATHETERIZATION N/A 01/10/2015   Procedure: Left Heart Cath and Coronary Angiography;  Surgeon: WJacolyn Reedy MD;  [facility];  Service: Cardiovascular;  Laterality: N/A;   CYSTOSCOPY  03/19/2012   Procedure: CYSTOSCOPY FLEXIBLE;  Surgeon: SFranchot Gallo MD;  [facility]  Service: Urology;  Laterality: N/A;  no seeds found in bladder   PROSTATE BIOPSY  10/10/11   Adenocarcinoma,gleason= 3+3=6,& 4+3=7,volume=75.8cc,Psa=8.29   RADIOACTIVE SEED IMPLANT  03/19/2012   Procedure: RADIOACTIVE SEED IMPLANT;  Surgeon: SFranchot Gallo MD;  [facility]  Service: Urology;  Laterality: N/A;  70  seeds implanted   TRANSURETHRAL  RESECTION OF PROSTATE N/A 08/31/2012   Procedure: TRANSURETHRAL RESECTION OF THE PROSTATE WITH GYRUS INSTRUMENTS;  Surgeon: SFranchot Gallo MD;  [facility]  Service: Urology;  Laterality: N/A;   Social History   Tobacco Use   Smoking status: Former    Packs/day: 0.25    Years: 25.00    Total pack years: 6.25    Types: Cigarettes    Quit date: 2014    Years since quitting: 9.5   Smokeless tobacco: Never  Substance Use Topics   Alcohol use: Yes    Alcohol/week: 0.0 standard drinks of alcohol    Comment: occasionally beer 1-2/day   Marital Status: Married   ROS  Review of Systems  Cardiovascular:  Negative for chest pain, dyspnea on exertion and leg swelling.  Gastrointestinal:  Negative for melena.   Objective  Blood pressure 132/75, pulse 61, temperature 98.2 F (36.8 C), temperature source Temporal, resp. rate 16, height '5\' 11"'  (1.803 m), weight 175 lb (79.4 kg), SpO2 99 %. Body mass index is 24.41 kg/m.      10/08/2021    1:25 PM 11/25/2020    3:57 PM 10/06/2020    1:07 PM  Vitals with BMI  Height '5\' 11"'   '5\' 11"'   Weight 175 lbs  173 lbs 3 oz  BMI 296.29 252.84 Systolic [id number] Diastolic 75 63 80  Pulse 61 63 63     Physical Exam Vitals reviewed.  HENT:     Head: Normocephalic and atraumatic.  Neck:     Vascular: No carotid bruit or  JVD.  Cardiovascular:     Rate and Rhythm: Normal rate and regular rhythm.     Pulses: Normal pulses and intact distal pulses.     Heart sounds: Normal heart sounds and S1 normal. No murmur heard.    No gallop.     Comments: Paradoxical split of second heart sound. Pulmonary:     Effort: Pulmonary effort is normal. No respiratory distress.     Breath sounds: Normal breath sounds. No wheezing, rhonchi or rales.  Abdominal:     General: Bowel sounds are normal.     Palpations: Abdomen is soft.  Musculoskeletal:     Right lower leg: No edema.     Left lower leg: No edema.  Skin:    Comments:  Xanthelasma left eyelid  Neurological:     Mental Status: He is alert.    Laboratory examination:   External labs:   Labs 09/12/2021:  Total cholesterol 294, triglycerides 144, HDL 67, LDL 108.  Non-HDL cholesterol: 137.  Hb 14.0/HCT 40.5, platelets 236, normal indicis.  BUN 16, creatinine 0.75, EGFR 91 mL, potassium 4.0, LFTs normal.  06/25/2018: Cholesterol 236, triglycerides 85, HDL 78, LDL 141.  Medications and allergy  No Known Allergies    Current Outpatient Medications:    carvedilol (COREG) 6.25 MG tablet, TAKE ONE TABLET BY MOUTH TWICE A DAY WITH A MEAL *NEEDS APPOINTMENT*, Disp: 180 tablet, Rfl: 3   co-enzyme Q-10 50 MG capsule, Take 50 mg by mouth daily., Disp: , Rfl:    ezetimibe (ZETIA) 10 MG tablet, TAKE ONE TABLET BY MOUTH DAILY AFTER SUPPER, Disp: 90 tablet, Rfl: 3   lisinopril-hydrochlorothiazide (ZESTORETIC) 20-25 MG tablet, TAKE ONE TABLET BY MOUTH DAILY, Disp: 90 tablet, Rfl: 3   Multiple Vitamin (MULTIVITAMIN) capsule, Take 1 capsule by mouth daily., Disp: , Rfl:    [START ON 12/28/2021] rosuvastatin (CRESTOR) 20 MG tablet, Take 1 tablet (20 mg total) by mouth daily., Disp: 30 tablet, Rfl: 2   vitamin C (ASCORBIC ACID) 500 MG tablet, Take 1,000 mg by mouth 2 (two) times daily. , Disp: , Rfl:      Radiology report 06/06/2021:  1. Lung-RADS 2, benign appearance or behavior. Continue annual screening with low-dose chest CT without contrast in 12 months. 2. Hepatic steatosis. 3. Left adrenal adenoma. 4. Aortic Atherosclerosis (ICD10-I70.0) and Emphysema (ICD10-J43.9). Coronary artery atherosclerosis.  Cardiac Studies:   Echo 2017: Mild concentric LVH with low normal decrease in global wall motion. Abnormal septal wall motion due to left bundle branch block. No wall motion abnormalities. Estimated LVEF of 45-50%. Trace mitral regurgitation.  Coronary angiogram 01/10/2015: Mid LAD lesion, 40% stenosed. Prox RCA lesion, 50% stenosed. 1.  Diffuse hypokinesis  compatible with cardiomyopathy 2.  Coronary artery disease with moderate segmental mid LAD stenosis and proximal and mid right coronary artery stenosis  Echocardiogram 04/20/2019:  Left ventricle cavity is normal in size. Moderate concentric hypertrophy  of the left ventricle. Abnormal septal wall motion due to left bundle  branch block. Mildly depressed LV systolic function with EF 45%. Doppler  evidence of grade I (impaired) diastolic dysfunction, normal LAP. Left  atrial cavity is mildly dilated.  Trileaflet aortic valve.  Trace aortic regurgitation.  Mild tricuspid regurgitation. Estimated pulmonary artery systolic pressure  is 24 mmHg.  No significant change compared to previous study on 09/22/2017.   EKG   EKG 10/06/2020: Normal sinus rhythm at rate of 61 bpm, left bundle branch block.  No further analysis.  No change from prior EKG 12/27/2019,  EKG 04/15/2019  Assessment     ICD-10-CM   1. Non-ischemic cardiomyopathy (HCC)  I42.8 EKG 12-Lead    2. LBBB (left bundle branch block)  I44.7     3. Primary hypertension  I10     4. Mixed hyperlipidemia  E78.2 rosuvastatin (CRESTOR) 20 MG tablet    Lipid Panel With LDL/HDL Ratio    Lipid Panel With LDL/HDL Ratio     Meds ordered this encounter  Medications   rosuvastatin (CRESTOR) 20 MG tablet    Sig: Take 1 tablet (20 mg total) by mouth daily.    Dispense:  30 tablet    Refill:  2    Discontinue Atorvastatin. He will finish the medications at home before starting this    Medications Discontinued During This Encounter  Medication Reason   doxycycline (VIBRAMYCIN) 100 MG capsule    atorvastatin (LIPITOR) 40 MG tablet Ineffective     Recommendations:   Thomas Grant  is a 71 y.o. Caucasian male with hypertension, hyperlipidemia, nonischemic cardiomyopathy with mild LV systolic dysfunction and moderate CAD by coronary angiogram in 2016 and chronic LBBB, former tobacco use quit 2014, history of prostate cancer in 2013 s/p  radiation therapy,.   Patient presents for annual visit, essentially remains asymptomatic.  I reviewed his CT scan of the chest that was done for lung cancer screening, he does have coronary atherosclerosis but he also has mild disease by angiography.  I would like to have his LDL goal to at least <100.  This is clearly improved from >140 last year after the addition of ezetimibe.  Option would be to increase the dose of the atorvastatin to 80 mg versus trying a new statin, patient is willing to try rosuvastatin 20 mg daily.  He has just picked up atorvastatin 90-day Rx hence will finish this medication before starting rosuvastatin 20 mg and sometime in the last week in November he will obtain lipid profile testing.  Otherwise stable from cardiac standpoint, no change in his physical exam, blood pressure is normal, I will see him back again in a year.  As there is no clinical evidence heart failure and there is no change in his physical exam, do not think he needs repeat echocardiogram.    Adrian Prows, MD, [facility] 10/08/2021, 2:02 PM Office: [phone] Fax: [phone] Pager: [phone]

## 2021-10-08 NOTE — Patient Instructions (Signed)
Please do not forget to get lab work done November 3 week before Thanksgiving at [place], orders have been placed.

## 2021-11-06 ENCOUNTER — Other Ambulatory Visit: Payer: Self-pay | Admitting: Cardiology

## 2021-11-06 DIAGNOSIS — I10 Essential (primary) hypertension: Secondary | ICD-10-CM

## 2021-12-27 ENCOUNTER — Other Ambulatory Visit: Payer: Self-pay | Admitting: Cardiology

## 2021-12-27 DIAGNOSIS — I2510 Atherosclerotic heart disease of native coronary artery without angina pectoris: Secondary | ICD-10-CM

## 2022-01-09 ENCOUNTER — Other Ambulatory Visit: Payer: Self-pay | Admitting: Cardiology

## 2022-01-09 DIAGNOSIS — E782 Mixed hyperlipidemia: Secondary | ICD-10-CM

## 2022-03-13 DIAGNOSIS — I10 Essential (primary) hypertension: Secondary | ICD-10-CM | POA: Diagnosis not present

## 2022-03-13 DIAGNOSIS — E7800 Pure hypercholesterolemia, unspecified: Secondary | ICD-10-CM | POA: Diagnosis not present

## 2022-04-15 ENCOUNTER — Other Ambulatory Visit: Payer: Self-pay

## 2022-04-15 DIAGNOSIS — E782 Mixed hyperlipidemia: Secondary | ICD-10-CM

## 2022-04-15 MED ORDER — ROSUVASTATIN CALCIUM 20 MG PO TABS: 20.0000 mg | ORAL_TABLET | Freq: Every day | ORAL | 1 refills | Status: DC

## 2022-04-15 MED ORDER — ROSUVASTATIN CALCIUM 20 MG PO TABS: 20.0000 mg | ORAL_TABLET | Freq: Every day | ORAL | 11 refills | Status: DC

## 2022-09-18 DIAGNOSIS — I10 Essential (primary) hypertension: Secondary | ICD-10-CM | POA: Diagnosis not present

## 2022-09-18 DIAGNOSIS — C61 Malignant neoplasm of prostate: Secondary | ICD-10-CM | POA: Diagnosis not present

## 2022-09-18 DIAGNOSIS — E7800 Pure hypercholesterolemia, unspecified: Secondary | ICD-10-CM | POA: Diagnosis not present

## 2022-09-25 DIAGNOSIS — Z0000 Encounter for general adult medical examination without abnormal findings: Secondary | ICD-10-CM | POA: Diagnosis not present

## 2022-09-25 DIAGNOSIS — I10 Essential (primary) hypertension: Secondary | ICD-10-CM | POA: Diagnosis not present

## 2022-09-25 DIAGNOSIS — I420 Dilated cardiomyopathy: Secondary | ICD-10-CM | POA: Diagnosis not present

## 2022-09-25 DIAGNOSIS — C61 Malignant neoplasm of prostate: Secondary | ICD-10-CM | POA: Diagnosis not present

## 2022-09-25 DIAGNOSIS — E7800 Pure hypercholesterolemia, unspecified: Secondary | ICD-10-CM | POA: Diagnosis not present

## 2022-09-25 DIAGNOSIS — I2510 Atherosclerotic heart disease of native coronary artery without angina pectoris: Secondary | ICD-10-CM | POA: Diagnosis not present

## 2022-09-25 DIAGNOSIS — D509 Iron deficiency anemia, unspecified: Secondary | ICD-10-CM | POA: Diagnosis not present

## 2022-09-27 ENCOUNTER — Encounter: Payer: Self-pay | Admitting: Cardiology

## 2022-09-27 NOTE — Progress Notes (Signed)
Labs 09/18/2022: Hb 14.0/HCT 42.0, platelets 250.  Normal indicis.  Serum glucose 82 mg,.  12, creatinine 0.78, EGFR 95 mill, potassium 4.6, LFTs normal.  Total cholesterol 213, triglycerides 215, HDL 70, LDL 107.

## 2022-10-09 ENCOUNTER — Ambulatory Visit: Payer: PPO | Admitting: Cardiology

## 2022-10-09 DIAGNOSIS — H35363 Drusen (degenerative) of macula, bilateral: Secondary | ICD-10-CM | POA: Diagnosis not present

## 2022-10-09 DIAGNOSIS — H43813 Vitreous degeneration, bilateral: Secondary | ICD-10-CM | POA: Diagnosis not present

## 2022-10-09 DIAGNOSIS — Z961 Presence of intraocular lens: Secondary | ICD-10-CM | POA: Diagnosis not present

## 2022-10-23 ENCOUNTER — Encounter: Payer: Self-pay | Admitting: Cardiology

## 2022-10-23 ENCOUNTER — Ambulatory Visit: Payer: PPO | Admitting: Cardiology

## 2022-10-23 VITALS — BP 130/69 | HR 62 | Resp 16 | Ht 71.0 in | Wt 177.2 lb

## 2022-10-23 DIAGNOSIS — I447 Left bundle-branch block, unspecified: Secondary | ICD-10-CM | POA: Diagnosis not present

## 2022-10-23 DIAGNOSIS — I428 Other cardiomyopathies: Secondary | ICD-10-CM

## 2022-10-23 DIAGNOSIS — E782 Mixed hyperlipidemia: Secondary | ICD-10-CM | POA: Diagnosis not present

## 2022-10-23 DIAGNOSIS — I10 Essential (primary) hypertension: Secondary | ICD-10-CM | POA: Diagnosis not present

## 2022-10-23 NOTE — Progress Notes (Signed)
Primary Physician/Referring:  Merri Brunette, MD  Patient ID: Carolyn Stare, male    DOB: 07/17/1950, 72 y.o.   MRN: [id number]  Chief Complaint  Patient presents with   Cardiomyopathy   Hyperlipidemia   coronary atherosclerosis   HPI:    HPI: Thomas DETTMER  is a 72 y.o. Caucasian male with hypertension, hyperlipidemia, nonischemic cardiomyopathy with mild LV systolic dysfunction and moderate CAD by coronary angiogram in 2016 and chronic LBBB, former tobacco use quit 2014, history of prostate cancer in 2013 s/p radiation therapy,.   Patient presents for annual visit, essentially remains asymptomatic.     Past Medical History:  Diagnosis Date   Atherosclerosis    BPH (benign prostatic hypertrophy) with urinary retention    History of radiation therapy 01/21/2012- 11/25   Prostate 45Gy/13fxs,of 1.8Gy   HTN (hypertension)    not actively treated   Hypercholesterolemia    Left bundle branch block (LBBB)    Prostate cancer (HCC) 10/10/11 bx---  s/p radioactive seed implants 03-19-2012   Adenocarcinoma,gleason=3+3=6,& 4+3=7,volume=75.8cc,Psa=8.29   Self-catheterizes urinary bladder    3 to 4 times daily   Past Surgical History:  Procedure Laterality Date   CARDIAC CATHETERIZATION N/A 01/10/2015   Procedure: Left Heart Cath and Coronary Angiography;  Surgeon: Othella Boyer, MD;  Location: [facility] INVASIVE CV LAB;  Service: Cardiovascular;  Laterality: N/A;   CYSTOSCOPY  03/19/2012   Procedure: CYSTOSCOPY FLEXIBLE;  Surgeon: Marcine Matar, MD;  Location: [facility];  Service: Urology;  Laterality: N/A;  no seeds found in bladder   PROSTATE BIOPSY  10/10/11   Adenocarcinoma,gleason= 3+3=6,& 4+3=7,volume=75.8cc,Psa=8.29   RADIOACTIVE SEED IMPLANT  03/19/2012   Procedure: RADIOACTIVE SEED IMPLANT;  Surgeon: Marcine Matar, MD;  Location: [facility];  Service: Urology;  Laterality: N/A;  70  seeds implanted   TRANSURETHRAL RESECTION OF PROSTATE  N/A 08/31/2012   Procedure: TRANSURETHRAL RESECTION OF THE PROSTATE WITH GYRUS INSTRUMENTS;  Surgeon: Marcine Matar, MD;  Location: [facility];  Service: Urology;  Laterality: N/A;   Social History   Tobacco Use   Smoking status: Former    Current packs/day: 0.00    Average packs/day: 0.3 packs/day for 25.0 years (6.3 ttl pk-yrs)    Types: Cigarettes    Start date: 45    Quit date: 2014    Years since quitting: 10.5   Smokeless tobacco: Never  Substance Use Topics   Alcohol use: Yes    Alcohol/week: 0.0 standard drinks of alcohol    Comment: occasionally beer 1-2/day   Marital Status: Married   ROS  Review of Systems  Cardiovascular:  Negative for chest pain, dyspnea on exertion and leg swelling.   Objective  Blood pressure 130/69, pulse 62, resp. rate 16, height 5\' 11"  (1.803 m), weight 177 lb 3.2 oz (80.4 kg), SpO2 98%. Body mass index is 24.71 kg/m.      10/23/2022    3:05 PM 10/08/2021    1:25 PM 11/25/2020    3:57 PM  Vitals with BMI  Height 5\' 11"  5\' 11"    Weight 177 lbs 3 oz 175 lbs   BMI 24.73 24.42   Systolic 130 132 409  Diastolic 69 75 63  Pulse 62 61 63     Physical Exam Neck:     Vascular: No carotid bruit or JVD.  Cardiovascular:     Rate and Rhythm: Normal rate and regular rhythm.     Pulses: Intact distal pulses.  Heart sounds: Normal heart sounds. No murmur heard.    No gallop.  Pulmonary:     Effort: Pulmonary effort is normal.     Breath sounds: Normal breath sounds.  Abdominal:     General: Bowel sounds are normal.     Palpations: Abdomen is soft.  Musculoskeletal:     Right lower leg: No edema.     Left lower leg: No edema.    Laboratory examination:   External labs:  Labs 09/18/2022: Hb 14.0/HCT 42.0, platelets 250.  Normal indicis.  Serum glucose 82 mg,.  12, creatinine 0.78, EGFR 95 mill, potassium 4.6, LFTs normal.  Total cholesterol 213, triglycerides 215, HDL 70, LDL 107.  Labs 09/12/2021:  Total  cholesterol 294, triglycerides 144, HDL 67, LDL 108.  Non-HDL cholesterol: 137.  Medications and allergy  No Known Allergies    Current Outpatient Medications:    carvedilol (COREG) 6.25 MG tablet, TAKE ONE TABLET BY MOUTH TWICE A DAY WITH A MEAL, Disp: 180 tablet, Rfl: 3   co-enzyme Q-10 50 MG capsule, Take 50 mg by mouth daily., Disp: , Rfl:    ezetimibe (ZETIA) 10 MG tablet, TAKE ONE TABLET BY MOUTH DAILY AFTER SUPPER, Disp: 90 tablet, Rfl: 3   lisinopril-hydrochlorothiazide (ZESTORETIC) 20-25 MG tablet, TAKE ONE TABLET BY MOUTH DAILY, Disp: 90 tablet, Rfl: 3   Multiple Vitamin (MULTIVITAMIN) capsule, Take 1 capsule by mouth daily., Disp: , Rfl:    rosuvastatin (CRESTOR) 20 MG tablet, Take 1 tablet (20 mg total) by mouth daily., Disp: 90 tablet, Rfl: 1   vitamin C (ASCORBIC ACID) 500 MG tablet, Take 1,000 mg by mouth 2 (two) times daily. , Disp: , Rfl:     Radiology report 06/06/2021: 1. Lung-RADS 2, benign appearance or behavior. Continue annual screening with low-dose chest CT without contrast in 12 months. 2. Hepatic steatosis. 3. Left adrenal adenoma. 4. Aortic Atherosclerosis (ICD10-I70.0) and Emphysema (ICD10-J43.9). 5. Coronary artery atherosclerosis.  Cardiac Studies:   Coronary angiogram 01/10/2015: Mid LAD lesion, 40% stenosed. Prox RCA lesion, 50% stenosed. 1.  Diffuse hypokinesis compatible with cardiomyopathy 2.  Coronary artery disease with moderate segmental mid LAD stenosis and proximal and mid right coronary artery stenosis  Echocardiogram 04/20/2019: Left ventricle cavity is normal in size. Moderate concentric hypertrophy of the left ventricle. Abnormal septal wall motion due to left bundle branch block. Mildly depressed LV systolic function with EF 45%. Doppler evidence of grade I (impaired) diastolic dysfunction, normal LAP. Left atrial cavity is mildly dilated. Trileaflet aortic valve.  Trace aortic regurgitation. Mild tricuspid regurgitation. Estimated pulmonary  artery systolic pressure is 24 mmHg. No significant change compared to previous study on 09/22/2017 and 2017.   EKG   EKG 10/23/2022: Normal sinus rhythm at the rate of 63 bpm, left atrial enlargement, left bundle branch block.  No change from 10/08/2021.  Assessment     ICD-10-CM   1. Non-ischemic cardiomyopathy (HCC)  I42.8 EKG 12-Lead    2. LBBB (left bundle branch block)  I44.7     3. Primary hypertension  I10     4. Mixed hyperlipidemia  E78.2      No orders of the defined types were placed in this encounter.  There are no discontinued medications.   Recommendations:   Thomas Grant  is a 72 y.o. Caucasian male with hypertension, hyperlipidemia, nonischemic cardiomyopathy with mild LV systolic dysfunction and moderate CAD by coronary angiogram in 2016 and chronic LBBB, former tobacco use quit 2014, history of prostate cancer in 2013  s/p radiation therapy.   1. Non-ischemic cardiomyopathy (HCC) Patient is currently doing well, has not had any heart failure symptoms, nonischemic cardiomyopathy related to underlying left bundle branch block however his LVEF has remained stable over the past several years from 2016 onwards.  I will see him back in a year for routine follow-up.  Do not think he needs repeat echocardiogram.  - EKG 12-Lead  2. LBBB (left bundle branch block) Patient has underlying chronic left bundle branch block again unchanged.  3. Primary hypertension Blood pressure is well-controlled he is on an ACE inhibitor and also on a beta-blocker.  4. Mixed hyperlipidemia Mixed hyperlipidemia persists in spite of being on high intensity statin and Zetia, I suspect this is related to excess alcohol intake, patient likes to drink 3 beers a day and sometimes some wine along with this during dinnertime.  I have discussed with him regarding changing to nonalcoholic beer this would certainly help with hepatic steatosis and also triglyceride elevation.  Overall remained  stable from cardiac standpoint, he continues to exercise on a daily basis but walk for at least 1 to 2 miles a day briskly without dyspnea or chest pain.   Yates Decamp, MD, [facility] 10/23/2022, 3:48 PM Office: [phone] Fax: [phone] Pager: [phone]

## 2022-10-25 DIAGNOSIS — H353132 Nonexudative age-related macular degeneration, bilateral, intermediate dry stage: Secondary | ICD-10-CM | POA: Diagnosis not present

## 2022-10-30 DIAGNOSIS — D120 Benign neoplasm of cecum: Secondary | ICD-10-CM | POA: Diagnosis not present

## 2022-10-30 DIAGNOSIS — D122 Benign neoplasm of ascending colon: Secondary | ICD-10-CM | POA: Diagnosis not present

## 2022-10-30 DIAGNOSIS — D123 Benign neoplasm of transverse colon: Secondary | ICD-10-CM | POA: Diagnosis not present

## 2022-10-30 DIAGNOSIS — K5730 Diverticulosis of large intestine without perforation or abscess without bleeding: Secondary | ICD-10-CM | POA: Diagnosis not present

## 2022-10-30 DIAGNOSIS — Z86010 Personal history of colonic polyps: Secondary | ICD-10-CM | POA: Diagnosis not present

## 2022-10-30 DIAGNOSIS — Z09 Encounter for follow-up examination after completed treatment for conditions other than malignant neoplasm: Secondary | ICD-10-CM | POA: Diagnosis not present

## 2022-10-31 ENCOUNTER — Other Ambulatory Visit: Payer: Self-pay

## 2022-10-31 DIAGNOSIS — I10 Essential (primary) hypertension: Secondary | ICD-10-CM

## 2022-10-31 DIAGNOSIS — E782 Mixed hyperlipidemia: Secondary | ICD-10-CM

## 2022-10-31 MED ORDER — LISINOPRIL-HYDROCHLOROTHIAZIDE 20-25 MG PO TABS: 1.0000 | ORAL_TABLET | Freq: Every day | ORAL | 3 refills | Status: DC

## 2022-10-31 MED ORDER — EZETIMIBE 10 MG PO TABS: 10.0000 mg | ORAL_TABLET | Freq: Every day | ORAL | 3 refills | Status: DC

## 2022-11-01 DIAGNOSIS — D122 Benign neoplasm of ascending colon: Secondary | ICD-10-CM | POA: Diagnosis not present

## 2022-11-01 DIAGNOSIS — D123 Benign neoplasm of transverse colon: Secondary | ICD-10-CM | POA: Diagnosis not present

## 2022-11-01 DIAGNOSIS — D120 Benign neoplasm of cecum: Secondary | ICD-10-CM | POA: Diagnosis not present

## 2022-12-30 ENCOUNTER — Other Ambulatory Visit: Payer: Self-pay

## 2022-12-30 DIAGNOSIS — I2510 Atherosclerotic heart disease of native coronary artery without angina pectoris: Secondary | ICD-10-CM

## 2022-12-30 MED ORDER — CARVEDILOL 6.25 MG PO TABS: 6.2500 mg | ORAL_TABLET | Freq: Two times a day (BID) | ORAL | 2 refills | Status: DC

## 2023-05-14 DIAGNOSIS — Z0000 Encounter for general adult medical examination without abnormal findings: Secondary | ICD-10-CM | POA: Diagnosis not present

## 2023-05-14 DIAGNOSIS — C61 Malignant neoplasm of prostate: Secondary | ICD-10-CM | POA: Diagnosis not present

## 2023-05-14 DIAGNOSIS — I10 Essential (primary) hypertension: Secondary | ICD-10-CM | POA: Diagnosis not present

## 2023-05-14 DIAGNOSIS — M6080 Other myositis, unspecified site: Secondary | ICD-10-CM | POA: Diagnosis not present

## 2023-05-14 DIAGNOSIS — E7800 Pure hypercholesterolemia, unspecified: Secondary | ICD-10-CM | POA: Diagnosis not present

## 2023-05-14 DIAGNOSIS — E875 Hyperkalemia: Secondary | ICD-10-CM | POA: Diagnosis not present

## 2023-07-07 ENCOUNTER — Other Ambulatory Visit: Payer: Self-pay

## 2023-07-07 DIAGNOSIS — E782 Mixed hyperlipidemia: Secondary | ICD-10-CM

## 2023-07-07 MED ORDER — ROSUVASTATIN CALCIUM 20 MG PO TABS: 20.0000 mg | ORAL_TABLET | Freq: Every day | ORAL | 0 refills | Status: DC

## 2023-09-24 DIAGNOSIS — Z0000 Encounter for general adult medical examination without abnormal findings: Secondary | ICD-10-CM | POA: Diagnosis not present

## 2023-09-24 DIAGNOSIS — E7800 Pure hypercholesterolemia, unspecified: Secondary | ICD-10-CM | POA: Diagnosis not present

## 2023-09-24 DIAGNOSIS — I10 Essential (primary) hypertension: Secondary | ICD-10-CM | POA: Diagnosis not present

## 2023-09-24 DIAGNOSIS — C61 Malignant neoplasm of prostate: Secondary | ICD-10-CM | POA: Diagnosis not present

## 2023-09-24 DIAGNOSIS — I2510 Atherosclerotic heart disease of native coronary artery without angina pectoris: Secondary | ICD-10-CM | POA: Diagnosis not present

## 2023-09-24 LAB — LAB REPORT - SCANNED: EGFR: 94

## 2023-09-25 ENCOUNTER — Other Ambulatory Visit: Payer: Self-pay

## 2023-09-25 DIAGNOSIS — I2510 Atherosclerotic heart disease of native coronary artery without angina pectoris: Secondary | ICD-10-CM

## 2023-09-25 MED ORDER — CARVEDILOL 6.25 MG PO TABS: 6.2500 mg | ORAL_TABLET | Freq: Two times a day (BID) | ORAL | 0 refills | Status: DC

## 2023-10-01 DIAGNOSIS — R3129 Other microscopic hematuria: Secondary | ICD-10-CM | POA: Diagnosis not present

## 2023-10-01 DIAGNOSIS — E7800 Pure hypercholesterolemia, unspecified: Secondary | ICD-10-CM | POA: Diagnosis not present

## 2023-10-01 DIAGNOSIS — Z0000 Encounter for general adult medical examination without abnormal findings: Secondary | ICD-10-CM | POA: Diagnosis not present

## 2023-10-01 DIAGNOSIS — I10 Essential (primary) hypertension: Secondary | ICD-10-CM | POA: Diagnosis not present

## 2023-10-01 DIAGNOSIS — C61 Malignant neoplasm of prostate: Secondary | ICD-10-CM | POA: Diagnosis not present

## 2023-10-02 ENCOUNTER — Other Ambulatory Visit: Payer: Self-pay

## 2023-10-02 DIAGNOSIS — E782 Mixed hyperlipidemia: Secondary | ICD-10-CM

## 2023-10-02 MED ORDER — ROSUVASTATIN CALCIUM 20 MG PO TABS: 20.0000 mg | ORAL_TABLET | Freq: Every day | ORAL | 0 refills | Status: DC

## 2023-10-03 ENCOUNTER — Ambulatory Visit (HOSPITAL_COMMUNITY): Payer: Self-pay

## 2023-10-23 ENCOUNTER — Ambulatory Visit: Payer: PPO | Admitting: Cardiology

## 2023-10-28 ENCOUNTER — Other Ambulatory Visit: Payer: Self-pay

## 2023-10-28 DIAGNOSIS — E782 Mixed hyperlipidemia: Secondary | ICD-10-CM

## 2023-10-28 MED ORDER — ROSUVASTATIN CALCIUM 20 MG PO TABS: 20.0000 mg | ORAL_TABLET | Freq: Every day | ORAL | 0 refills | Status: DC

## 2023-11-04 ENCOUNTER — Other Ambulatory Visit: Payer: Self-pay | Admitting: Cardiology

## 2023-11-04 DIAGNOSIS — I10 Essential (primary) hypertension: Secondary | ICD-10-CM

## 2023-11-04 DIAGNOSIS — E782 Mixed hyperlipidemia: Secondary | ICD-10-CM

## 2023-11-10 ENCOUNTER — Other Ambulatory Visit: Payer: Self-pay

## 2023-11-10 DIAGNOSIS — E782 Mixed hyperlipidemia: Secondary | ICD-10-CM

## 2023-11-10 MED ORDER — ROSUVASTATIN CALCIUM 20 MG PO TABS: 20.0000 mg | ORAL_TABLET | Freq: Every day | ORAL | 0 refills | Status: DC

## 2023-11-26 DIAGNOSIS — R300 Dysuria: Secondary | ICD-10-CM | POA: Diagnosis not present

## 2023-11-26 DIAGNOSIS — N401 Enlarged prostate with lower urinary tract symptoms: Secondary | ICD-10-CM | POA: Diagnosis not present

## 2023-11-26 DIAGNOSIS — R310 Gross hematuria: Secondary | ICD-10-CM | POA: Diagnosis not present

## 2023-11-26 DIAGNOSIS — N320 Bladder-neck obstruction: Secondary | ICD-10-CM | POA: Diagnosis not present

## 2023-11-26 DIAGNOSIS — Z8546 Personal history of malignant neoplasm of prostate: Secondary | ICD-10-CM | POA: Diagnosis not present

## 2023-11-28 ENCOUNTER — Other Ambulatory Visit: Payer: Self-pay | Admitting: Urology

## 2023-12-02 ENCOUNTER — Other Ambulatory Visit: Payer: Self-pay

## 2023-12-02 DIAGNOSIS — E782 Mixed hyperlipidemia: Secondary | ICD-10-CM

## 2023-12-05 NOTE — Patient Instructions (Addendum)
 SURGICAL WAITING ROOM VISITATION  Patients having surgery or a procedure may have no more than 2 support people in the waiting area - these visitors may rotate.    Children under the age of 55 must have an adult with them who is not the patient.  Visitors with respiratory illnesses are discouraged from visiting and should remain at home.  If the patient needs to stay at the hospital during part of their recovery, the visitor guidelines for inpatient rooms apply. Pre-op nurse will coordinate an appropriate time for 1 support person to accompany patient in pre-op.  This support person may not rotate.    Please refer to the [facility] website for the visitor guidelines for Inpatients (after your surgery is over and you are in a regular room).       Your procedure is scheduled on: 12-18-23   Report to [facility] Main Entrance    Report to admitting at     1:15 PM   Call this number if you have problems the morning of surgery [phone]   Do not eat food :After Midnight.   After Midnight you may have the following liquids until _0930_____ AM/ DAY OF SURGERY   then nothing by mouth  Water                                                          Sports drinks like Gatorade (NO RED)                          If you have questions, please contact your surgeon's office.   FOLLOW  ANY ADDITIONAL PRE OP INSTRUCTIONS YOU RECEIVED FROM YOUR SURGEON'S OFFICE!!!     Oral Hygiene is also important to reduce your risk of infection.                                    Remember - BRUSH YOUR TEETH THE MORNING OF SURGERY WITH YOUR REGULAR TOOTHPASTE  DENTURES WILL BE REMOVED PRIOR TO SURGERY PLEASE DO NOT APPLY Poly grip OR ADHESIVES!!!   Do NOT smoke after Midnight   Stop all vitamins and herbal supplements 7 days before surgery.   Take these medicines the morning of surgery with A SIP OF WATER : rosuvastatin , zetia , carvedilol     Bring CPAP mask and tubing day of surgery.                               You may not have any metal on your body including hair pins, jewelry, and body piercing             Do not wear  lotions, powders, /cologne, or deodorant               Men may shave face and neck.   Do not bring valuables to the hospital. [facility] IS NOT             RESPONSIBLE   FOR VALUABLES.   Contacts, glasses, dentures or bridgework may not be worn into surgery.   Bring small overnight bag day of surgery.   DO NOT BRING YOUR HOME MEDICATIONS TO THE  HOSPITAL. PHARMACY WILL DISPENSE MEDICATIONS LISTED ON YOUR MEDICATION LIST TO YOU DURING YOUR ADMISSION IN THE HOSPITAL!    Patients discharged on the day of surgery will not be allowed to drive home.  Someone NEEDS to stay with you for the first 24 hours after anesthesia.   Special Instructions: Bring a copy of your healthcare power of attorney and living will documents the day of surgery if you haven't scanned them before.              Please read over the following fact sheets you were given: IF YOU HAVE QUESTIONS ABOUT YOUR PRE-OP INSTRUCTIONS PLEASE CALL [phone].   If you test positive for Covid or have been in contact with anyone that has tested positive in the last 10 days please notify you surgeon.     - Preparing for Surgery Before surgery, you can play an important role.  Because skin is not sterile, your skin needs to be as free of germs as possible.  You can reduce the number of germs on your skin by washing with CHG (chlorahexidine gluconate) soap before surgery.  CHG is an antiseptic cleaner which kills germs and bonds with the skin to continue killing germs even after washing. Please DO NOT use if you have an allergy to CHG or antibacterial soaps.  If your skin becomes reddened/irritated stop using the CHG and inform your nurse when you arrive at Short Stay. Do not shave (including legs and underarms) for at least 48 hours prior to the first CHG shower.  You may shave your  face/neck. Please follow these instructions carefully:  1.  Shower with CHG Soap the night before surgery and the  morning of Surgery.  2.  If you choose to wash your hair, wash your hair first as usual with your  normal  shampoo.  3.  After you shampoo, rinse your hair and body thoroughly to remove the  shampoo.                            4.  Use CHG as you would any other liquid soap.  You can apply chg directly  to the skin and wash                       Gently with a scrungie or clean washcloth.  5.  Apply the CHG Soap to your body ONLY FROM THE NECK DOWN.   Do not use on face/ open                           Wound or open sores. Avoid contact with eyes, ears mouth and genitals (private parts).                       Wash face,  Genitals (private parts) with your normal soap.             6.  Wash thoroughly, paying special attention to the area where your surgery  will be performed.  7.  Thoroughly rinse your body with warm water  from the neck down.  8.  DO NOT shower/wash with your normal soap after using and rinsing off  the CHG Soap.                9.  Pat yourself dry with a clean towel.  10.  Wear clean pajamas.            11.  Place clean sheets on your bed the night of your first shower and do not  sleep with pets. Day of Surgery : Do not apply any lotions/deodorants the morning of surgery.  Please wear clean clothes to the hospital/surgery center.  FAILURE TO FOLLOW THESE INSTRUCTIONS MAY RESULT IN THE CANCELLATION OF YOUR SURGERY PATIENT SIGNATURE_________________________________  NURSE SIGNATURE__________________________________  ________________________________________________________________________

## 2023-12-05 NOTE — Progress Notes (Addendum)
 PCP - Ryan Hives, MD Cardiologist - Gordy Bergamo, MD Thomas Grant 10-23-22 epic  PPM/ICD -  Device Orders -  Rep Notified -   Chest x-ray -  EKG - 12-10-23 preop Stress Test -  ECHO - 04-20-2019 epic Cardiac Cath - 2016 epic  Sleep Study -  CPAP -   Fasting Blood Sugar - n/a Checks Blood Sugar __n/a___ times a day  Blood Thinner Instructions:n/a Aspirin  Instructions:n/a  ERAS Protcol - PRE-SURGERY En/n   COVID vaccine -y  Activity--Able to climb a flight of stairs with no CP or SOB Anesthesia review: LBBB,CAD, nonischemic Cardiomyopathy  Patient denies shortness of breath, fever, cough and chest pain at PAT appointment   All instructions explained to the patient, with a verbal understanding of the material. Patient agrees to go over the instructions while at home for a better understanding. Patient also instructed to self quarantine after being tested for COVID-19. The opportunity to ask questions was provided.

## 2023-12-08 ENCOUNTER — Other Ambulatory Visit: Payer: Self-pay

## 2023-12-08 DIAGNOSIS — E782 Mixed hyperlipidemia: Secondary | ICD-10-CM

## 2023-12-10 ENCOUNTER — Encounter (HOSPITAL_COMMUNITY)
Admission: RE | Admit: 2023-12-10 | Discharge: 2023-12-10 | Disposition: A | Discharge status: Home / Self Care | Source: Ambulatory Visit | Attending: Urology | Admitting: Urology

## 2023-12-10 ENCOUNTER — Encounter (HOSPITAL_COMMUNITY): Payer: Self-pay

## 2023-12-10 ENCOUNTER — Other Ambulatory Visit: Payer: Self-pay

## 2023-12-10 VITALS — BP 127/81 | HR 65 | Temp 98.0°F | Resp 18 | Ht 71.0 in | Wt 168.8 lb

## 2023-12-10 DIAGNOSIS — N320 Bladder-neck obstruction: Secondary | ICD-10-CM | POA: Insufficient documentation

## 2023-12-10 DIAGNOSIS — Z923 Personal history of irradiation: Secondary | ICD-10-CM | POA: Insufficient documentation

## 2023-12-10 DIAGNOSIS — I2510 Atherosclerotic heart disease of native coronary artery without angina pectoris: Secondary | ICD-10-CM | POA: Insufficient documentation

## 2023-12-10 DIAGNOSIS — R310 Gross hematuria: Secondary | ICD-10-CM | POA: Diagnosis not present

## 2023-12-10 DIAGNOSIS — I428 Other cardiomyopathies: Secondary | ICD-10-CM | POA: Diagnosis not present

## 2023-12-10 DIAGNOSIS — Z87891 Personal history of nicotine dependence: Secondary | ICD-10-CM | POA: Insufficient documentation

## 2023-12-10 DIAGNOSIS — I10 Essential (primary) hypertension: Secondary | ICD-10-CM | POA: Insufficient documentation

## 2023-12-10 DIAGNOSIS — Z8546 Personal history of malignant neoplasm of prostate: Secondary | ICD-10-CM | POA: Insufficient documentation

## 2023-12-10 DIAGNOSIS — Z01818 Encounter for other preprocedural examination: Secondary | ICD-10-CM | POA: Insufficient documentation

## 2023-12-10 DIAGNOSIS — I447 Left bundle-branch block, unspecified: Secondary | ICD-10-CM | POA: Diagnosis not present

## 2023-12-10 LAB — BASIC METABOLIC PANEL WITH GFR
Anion gap: 14 (ref 5–15)
BUN: 13 mg/dL (ref 8–23)
CO2: 24 mmol/L (ref 22–32)
Calcium: 9.9 mg/dL (ref 8.9–10.3)
Chloride: 93 mmol/L — ABNORMAL LOW (ref 98–111)
Creatinine, Ser: 0.89 mg/dL (ref 0.61–1.24)
GFR, Estimated: >60 mL/min (ref >60)
Glucose, Bld: 100 mg/dL — ABNORMAL HIGH (ref 70–99)
Potassium: 4.7 mmol/L (ref 3.5–5.1)
Sodium: 131 mmol/L — ABNORMAL LOW (ref 135–145)

## 2023-12-10 LAB — CBC
HCT: 43.5 % (ref 39.0–52.0)
Hemoglobin: 14 g/dL (ref 13.0–17.0)
MCH: 30 pg (ref 26.0–34.0)
MCHC: 32.2 g/dL (ref 30.0–36.0)
MCV: 93.3 fL (ref 80.0–100.0)
Platelets: 232 K/uL (ref 150–400)
RBC: 4.66 MIL/uL (ref 4.22–5.81)
RDW: 13 % (ref 11.5–15.5)
WBC: 6.3 K/uL (ref 4.0–10.5)
nRBC: 0 % (ref 0.0–0.2)

## 2023-12-11 ENCOUNTER — Encounter (HOSPITAL_COMMUNITY): Payer: Self-pay

## 2023-12-11 NOTE — Anesthesia Preprocedure Evaluation (Addendum)
 Anesthesia Evaluation  Patient identified by MRN, date of birth, ID band Patient awake    Reviewed: Allergy & Precautions, NPO status , Patient's Chart, lab work & pertinent test results  History of Anesthesia Complications Negative for: history of anesthetic complications  Airway Mallampati: II  TM Distance: >3 FB Neck ROM: Full    Dental no notable dental hx. (+) Teeth Intact   Pulmonary neg pulmonary ROS, neg sleep apnea, neg COPD, Patient abstained from smoking.Not current smoker, former smoker   Pulmonary exam normal breath sounds clear to auscultation       Cardiovascular Exercise Tolerance: Good METShypertension, Pt. on medications +CHF  (-) CAD and (-) Past MI (-) dysrhythmias  Rhythm:Regular Rate:Normal - Systolic murmurs Echocardiogram 04/20/2019:  Left ventricle cavity is normal in size. Moderate concentric hypertrophy  of the left ventricle. Abnormal septal wall motion due to left bundle  branch block. Mildly depressed LV systolic function with EF 45%. Doppler  evidence of grade I (impaired) diastolic dysfunction, normal LAP. Left  atrial cavity is mildly dilated.  Trileaflet aortic valve.  Trace aortic regurgitation.  Mild tricuspid regurgitation. Estimated pulmonary artery systolic pressure  is 24 mmHg.  No significant change compared to previous study on 09/22/2017.     Neuro/Psych negative neurological ROS  negative psych ROS   GI/Hepatic ,neg GERD  ,,(+)     (-) substance abuse    Endo/Other  neg diabetes    Renal/GU negative Renal ROS     Musculoskeletal   Abdominal   Peds  Hematology   Anesthesia Other Findings Past Medical History: No date: Atherosclerosis No date: BPH (benign prostatic hypertrophy) with urinary retention 01/21/2012- 11/25: History of radiation therapy     Comment:  Prostate 45Gy/61fxs,of 1.8Gy No date: HTN (hypertension)     Comment:  not actively treated No date:  Hypercholesterolemia No date: Left bundle branch block (LBBB) 10/10/11 bx---  s/p radioactive seed implants 03-19-2012: Prostate  cancer (HCC)     Comment:  Adenocarcinoma,gleason=3+3=6,&               4+3=7,volume=75.8cc,Psa=8.29 No date: Self-catheterizes urinary bladder     Comment:  3 to 4 times daily  Reproductive/Obstetrics                              Anesthesia Physical Anesthesia Plan  ASA: 3  Anesthesia Plan: General   Post-op Pain Management: Tylenol  PO (pre-op)*   Induction: Intravenous  PONV Risk Score and Plan: 3 and Ondansetron , Dexamethasone  and Treatment may vary due to age or medical condition  Airway Management Planned: LMA  Additional Equipment: None  Intra-op Plan:   Post-operative Plan: Extubation in OR  Informed Consent: I have reviewed the patients History and Physical, chart, labs and discussed the procedure including the risks, benefits and alternatives for the proposed anesthesia with the patient or authorized representative who has indicated his/her understanding and acceptance.     Dental advisory given  Plan Discussed with: CRNA and Surgeon  Anesthesia Plan Comments: (Discussed risks of anesthesia with patient, including PONV, sore throat, lip/dental/eye damage. Rare risks discussed as well, such as cardiorespiratory and neurological sequelae, and allergic reactions. Discussed the role of CRNA in patient's perioperative care. Patient understands.)         Anesthesia Quick Evaluation

## 2023-12-11 NOTE — Progress Notes (Signed)
 Case: [id number] Date/Time: 12/18/23 1515   Procedures:      CYSTOURETHROSCOPY, WITH URETHRAL STRICTURE DILATION USING DRUG-COATED BALLOON     CYSTOSCOPY, WITH RETROGRADE PYELOGRAM     TURBT (TRANSURETHRAL RESECTION OF BLADDER TUMOR)     TURBT, WITH CHEMOTHERAPEUTIC AGENT INSTILLATION INTO BLADDER   Anesthesia type: General   Diagnosis:      Bladder neck obstruction [N32.0]     Gross hematuria [R31.0]   Pre-op diagnosis: BLADDER NECK CONTRUCTURE, GROSS HEMATURIA   Location: [facility] ROOM 06 / [facility]   Surgeons: Shane Steffan BROCKS, MD       DISCUSSION: Thomas Grant is a 73 yo male with PMH of former smoking, HTN, moderate CAD (by cath), NICM, LBBB, prostate cancer s/p XRT (2013)  Patient follows with Cardiology for hx of nonischemic cardiomyopathy with mild LV systolic dysfunction (EF 45% by echo in 2021), moderate CAD by coronary angiogram in 2016, and chronic LBBB . Last seen by Dr. Ladona in 09/2022. Patient noted to be stable at that visit. Advised f/u in 1 year.  Last seen in PCP clinic on 10/01/23. All issues stable.  At PAT visit patient reports being active and denies CP/SOB with activity. Anticipate he can proceed.  VS: BP 127/81   Pulse 65   Temp 36.7 C (Oral)   Resp 18   Ht 5' 11 (1.803 m)   Wt 76.6 kg   SpO2 99%   BMI 23.54 kg/m   PROVIDERS: Clarice Nottingham, MD   LABS: Labs reviewed: Acceptable for surgery. (all labs ordered are listed, but only abnormal results are displayed)  Labs Reviewed  BASIC METABOLIC PANEL WITH GFR - Abnormal; Notable for the following components:      Result Value   Sodium 131 (*)    Chloride 93 (*)    Glucose, Bld 100 (*)    All other components within normal limits  CBC     IMAGES:  CT Chest 06/06/2021:   IMPRESSION: 1. Lung-RADS 2, benign appearance or behavior. Continue annual screening with low-dose chest CT without contrast in 12 months. 2. Hepatic steatosis. 3. Left adrenal adenoma. 4. Aortic Atherosclerosis  (ICD10-I70.0) and Emphysema (ICD10-J43.9). Coronary artery atherosclerosis.   EKG 12/10/23:  Normal sinus rhythm Left axis deviation Left bundle branch block Abnormal ECG When compared with ECG of 10-Jan-2015 06:53, No significant change since last tracing  CV:  Echocardiogram 04/20/2019: Left ventricle cavity is normal in size. Moderate concentric hypertrophy of the left ventricle. Abnormal septal wall motion due to left bundle branch block. Mildly depressed LV systolic function with EF 45%. Doppler evidence of grade I (impaired) diastolic dysfunction, normal LAP. Left atrial cavity is mildly dilated. Trileaflet aortic valve.  Trace aortic regurgitation. Mild tricuspid regurgitation. Estimated pulmonary artery systolic pressure is 24 mmHg. No significant change compared to previous study on 09/22/2017.   LHC 01/10/2015:  Mid LAD lesion, 40% stenosed. Prox RCA lesion, 50% stenosed.   1.  Diffuse hypokinesis compatible with cardiomyopathy 2.  Coronary artery disease with moderate segmental mid LAD stenosis and proximal and mid right coronary artery stenosis   RECOMMENDATIONS:   Medical therapy for cardiomyopathy.  Treatment of coronary artery disease Past Medical History:  Diagnosis Date   Atherosclerosis    BPH (benign prostatic hypertrophy) with urinary retention    History of radiation therapy 01/21/2012- 11/25   Prostate 45Gy/61fxs,of 1.8Gy   HTN (hypertension)    not actively treated   Hypercholesterolemia    Left bundle branch block (LBBB)  Prostate cancer (HCC) 10/10/11 bx---  s/p radioactive seed implants 03-19-2012   Adenocarcinoma,gleason=3+3=6,& 4+3=7,volume=75.8cc,Psa=8.29   Self-catheterizes urinary bladder    3 to 4 times daily    Past Surgical History:  Procedure Laterality Date   CARDIAC CATHETERIZATION N/A 01/10/2015   Procedure: Left Heart Cath and Coronary Angiography;  Surgeon: Elsie GORMAN Somerset, MD;  Location: [facility] INVASIVE CV LAB;  Service:  Cardiovascular;  Laterality: N/A;   CYSTOSCOPY  03/19/2012   Procedure: CYSTOSCOPY FLEXIBLE;  Surgeon: Garnette Shack, MD;  Location: [facility];  Service: Urology;  Laterality: N/A;  no seeds found in bladder   PROSTATE BIOPSY  10/10/11   Adenocarcinoma,gleason= 3+3=6,& 4+3=7,volume=75.8cc,Psa=8.29   RADIOACTIVE SEED IMPLANT  03/19/2012   Procedure: RADIOACTIVE SEED IMPLANT;  Surgeon: Garnette Shack, MD;  Location: [facility];  Service: Urology;  Laterality: N/A;  71  seeds implanted   TRANSURETHRAL RESECTION OF PROSTATE N/A 08/31/2012   Procedure: TRANSURETHRAL RESECTION OF THE PROSTATE WITH GYRUS INSTRUMENTS;  Surgeon: Garnette Shack, MD;  Location: [facility];  Service: Urology;  Laterality: N/A;    MEDICATIONS:  carvedilol  (COREG ) 6.25 MG tablet   co-enzyme Q-10 50 MG capsule   ezetimibe  (ZETIA ) 10 MG tablet   lisinopril -hydrochlorothiazide  (ZESTORETIC ) 20-25 MG tablet   Multiple Vitamin (MULTIVITAMIN) capsule   Omega-3 Fatty Acids (FISH OIL ) 1000 MG CAPS   rosuvastatin  (CRESTOR ) 20 MG tablet   vitamin C (ASCORBIC ACID) 500 MG tablet   No current facility-administered medications for this encounter.    Burnard CHRISTELLA Odis DEVONNA [facility] Surgical Short Stay/Anesthesiology [facility] Phone [phone] 12/11/2023 11:15 AM

## 2023-12-16 ENCOUNTER — Other Ambulatory Visit: Payer: Self-pay | Admitting: Cardiology

## 2023-12-16 DIAGNOSIS — E782 Mixed hyperlipidemia: Secondary | ICD-10-CM

## 2023-12-17 NOTE — H&P (Signed)
 H&P  Chief Complaint: Gross hematuria and bladder neck contracture  History of Present Illness: 73 M w/ hx of Gross hematura and bladder neck contracture.  Past Medical History:  Diagnosis Date   Atherosclerosis    BPH (benign prostatic hypertrophy) with urinary retention    History of radiation therapy 01/21/2012- 11/25   Prostate 45Gy/51fxs,of 1.8Gy   HTN (hypertension)    not actively treated   Hypercholesterolemia    Left bundle branch block (LBBB)    Prostate cancer (HCC) 10/10/11 bx---  s/p radioactive seed implants 03-19-2012   Adenocarcinoma,gleason=3+3=6,& 4+3=7,volume=75.8cc,Psa=8.29   Self-catheterizes urinary bladder    3 to 4 times daily   Past Surgical History:  Procedure Laterality Date   CARDIAC CATHETERIZATION N/A 01/10/2015   Procedure: Left Heart Cath and Coronary Angiography;  Surgeon: Elsie GORMAN Somerset, MD;  Location: [facility] INVASIVE CV LAB;  Service: Cardiovascular;  Laterality: N/A;   CYSTOSCOPY  03/19/2012   Procedure: CYSTOSCOPY FLEXIBLE;  Surgeon: Garnette Shack, MD;  Location: [facility];  Service: Urology;  Laterality: N/A;  no seeds found in bladder   PROSTATE BIOPSY  10/10/11   Adenocarcinoma,gleason= 3+3=6,& 4+3=7,volume=75.8cc,Psa=8.29   RADIOACTIVE SEED IMPLANT  03/19/2012   Procedure: RADIOACTIVE SEED IMPLANT;  Surgeon: Garnette Shack, MD;  Location: [facility];  Service: Urology;  Laterality: N/A;  70  seeds implanted   TRANSURETHRAL RESECTION OF PROSTATE N/A 08/31/2012   Procedure: TRANSURETHRAL RESECTION OF THE PROSTATE WITH GYRUS INSTRUMENTS;  Surgeon: Garnette Shack, MD;  Location: [facility];  Service: Urology;  Laterality: N/A;    Home Medications:  No medications prior to admission.   Allergies: No Known Allergies  Family History  Problem Relation Age of Onset   Diabetes type II Mother        Lupus alive at age 74   Alzheimer's disease Father        deceased age 37   Prostate cancer  Brother 68       seed implant   Social History:  reports that he quit smoking about 11 years ago. His smoking use included cigarettes. He started smoking about 36 years ago. He has a 6.3 pack-year smoking history. He has never used smokeless tobacco. He reports current alcohol use. He reports that he does not use drugs.  ROS: A complete review of systems was performed.  All systems are negative except for pertinent findings as noted. ROS   Physical Exam:  Vital signs in last 24 hours:   General:  Alert and oriented, No acute distress HEENT: Normocephalic, atraumatic Neck: No JVD or lymphadenopathy Cardiovascular: Regular rate and rhythm Lungs: Regular rate and effort Abdomen: Soft, nontender, nondistended, no abdominal masses Back: No CVA tenderness Extremities: No edema Neurologic: Grossly intact  Laboratory Data:  No results found for this or any previous visit (from the past 24 hours). No results found for this or any previous visit (from the past 240 hours). Creatinine: No results for input(s): CREATININE in the last 168 hours.  Impression/Assessment:  ***  Plan:  ***  Thomas Grant 12/17/2023, 5:51 PM

## 2023-12-17 NOTE — Progress Notes (Signed)
 Verbalizes understanding of 1145 arrival to [facility] and stopping clear liquids at 0800 12/18/2023.

## 2023-12-18 ENCOUNTER — Ambulatory Visit (HOSPITAL_COMMUNITY): Payer: Self-pay | Admitting: Medical

## 2023-12-18 ENCOUNTER — Other Ambulatory Visit: Payer: Self-pay

## 2023-12-18 ENCOUNTER — Ambulatory Visit (HOSPITAL_COMMUNITY): Admitting: Certified Registered"

## 2023-12-18 ENCOUNTER — Ambulatory Visit (HOSPITAL_COMMUNITY)
Admission: RE | Admit: 2023-12-18 | Discharge: 2023-12-18 | Disposition: A | Discharge status: Home / Self Care | Attending: Urology | Admitting: Urology

## 2023-12-18 ENCOUNTER — Encounter (HOSPITAL_COMMUNITY)
Admission: RE | Disposition: A | Discharge status: Home / Self Care | Payer: Self-pay | Source: Home / Self Care | Attending: Urology

## 2023-12-18 ENCOUNTER — Encounter (HOSPITAL_COMMUNITY): Payer: Self-pay | Admitting: Urology

## 2023-12-18 ENCOUNTER — Ambulatory Visit (HOSPITAL_COMMUNITY)

## 2023-12-18 DIAGNOSIS — N320 Bladder-neck obstruction: Secondary | ICD-10-CM | POA: Insufficient documentation

## 2023-12-18 DIAGNOSIS — Z8546 Personal history of malignant neoplasm of prostate: Secondary | ICD-10-CM | POA: Diagnosis not present

## 2023-12-18 DIAGNOSIS — I509 Heart failure, unspecified: Secondary | ICD-10-CM | POA: Diagnosis not present

## 2023-12-18 DIAGNOSIS — R310 Gross hematuria: Secondary | ICD-10-CM | POA: Diagnosis not present

## 2023-12-18 DIAGNOSIS — I110 Hypertensive heart disease with heart failure: Secondary | ICD-10-CM

## 2023-12-18 DIAGNOSIS — N35819 Other urethral stricture, male, unspecified site: Secondary | ICD-10-CM | POA: Diagnosis not present

## 2023-12-18 DIAGNOSIS — Z923 Personal history of irradiation: Secondary | ICD-10-CM | POA: Diagnosis not present

## 2023-12-18 DIAGNOSIS — Z87891 Personal history of nicotine dependence: Secondary | ICD-10-CM | POA: Insufficient documentation

## 2023-12-18 DIAGNOSIS — N35919 Unspecified urethral stricture, male, unspecified site: Secondary | ICD-10-CM | POA: Diagnosis not present

## 2023-12-18 HISTORY — PX: CYSTOURETHROSCOPY, W/ URETHRAL STRICTURE DILATION USING DRUG-COATED BALLOON: SHX7696

## 2023-12-18 HISTORY — PX: CYSTOSCOPY W/ RETROGRADES: SHX1426

## 2023-12-18 IMAGING — CT CT CHEST LUNG CANCER SCREENING LOW DOSE W/O CM
3 of 5 series · 13 of 40 positions shown, 14 images · non-contrast
Comparison: 10/31/2011 abdominal CT.

CLINICAL DATA: Forty-seven pack-year smoking history. Quit 8 years
ago. History of prostate cancer.



[Series 2: lung 5.00 br40 axial · axial · 0.66mm/px · z∈[-1055,-940]mm · 2 of 70 slices shown, 3 images]
[im 24/70  mediastinal]
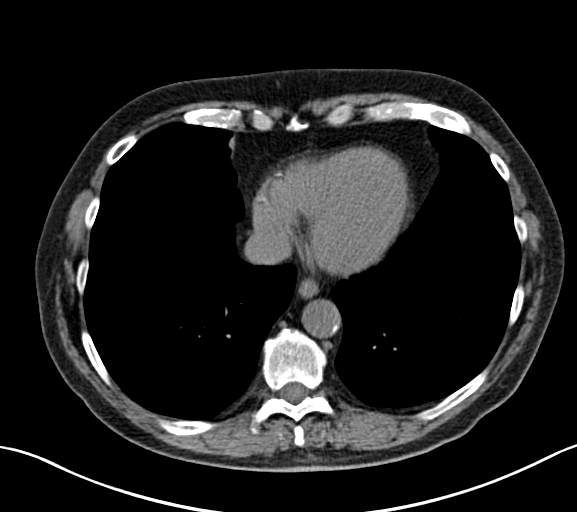
[im 24/70  lung]
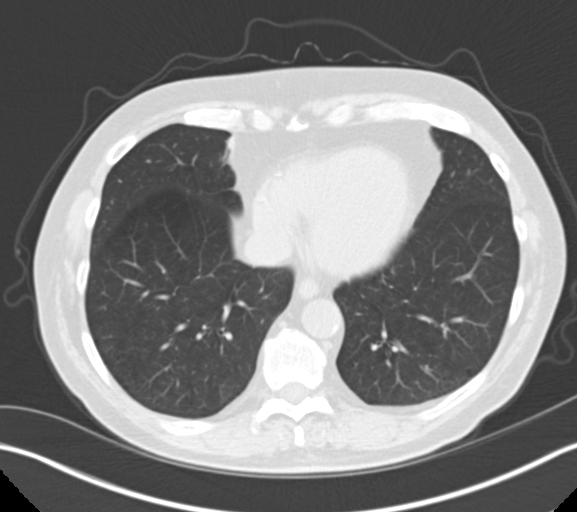
[im 47/70  lung]
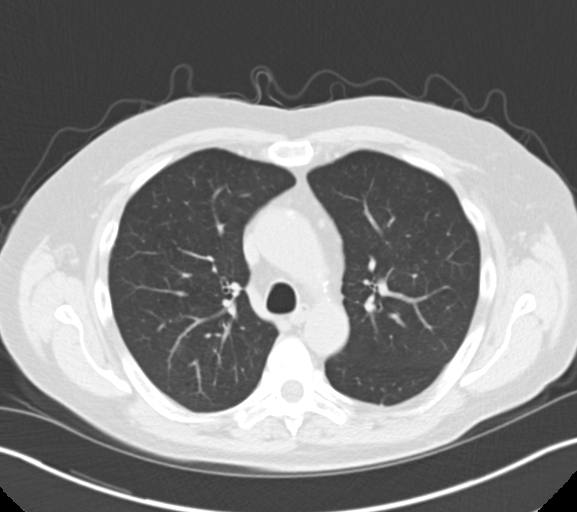

[Series 4: lung 1.00 br44 cor · coronal · 0.65mm/px · 3 of 355 slices shown]
[im 71/355  lung]
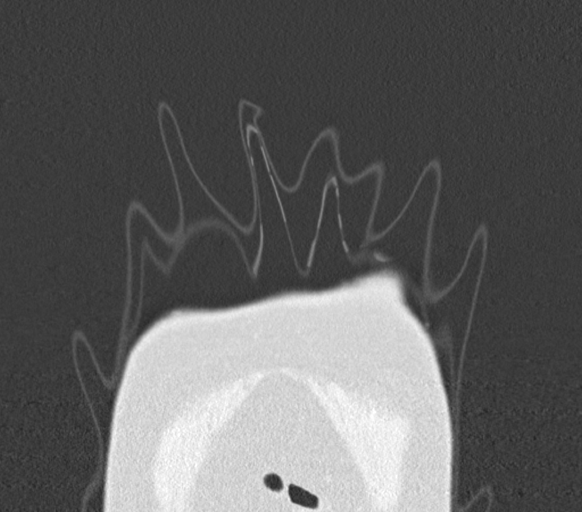
[im 142/355  lung]
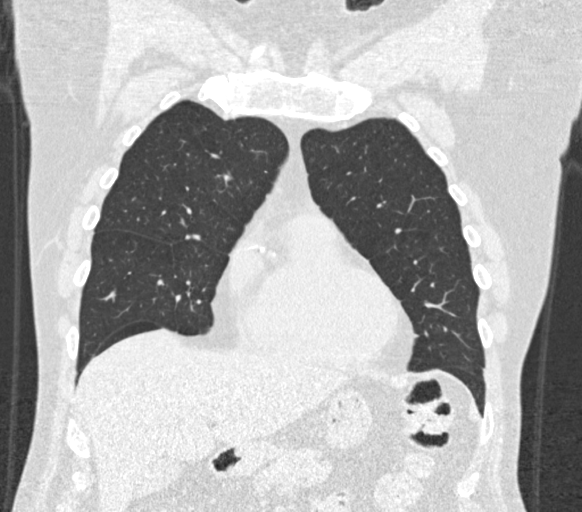
[im 213/355  lung]
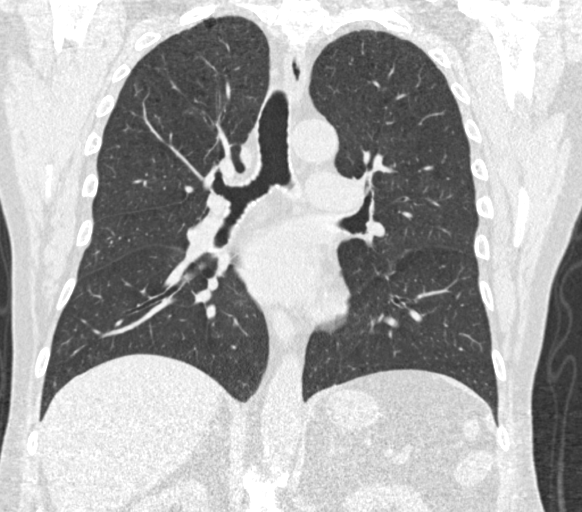

[Series 9: lung 1.00 br60 axial · axial · 0.74mm/px · z∈[-1160,-885]mm · 8 of 337 slices shown]
[im 31/337  lung]
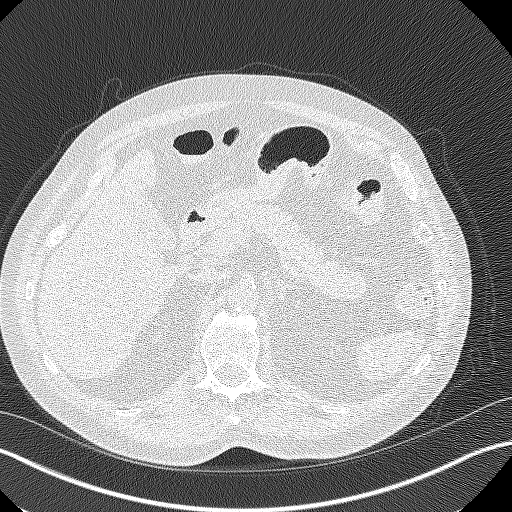
[im 62/337  lung]
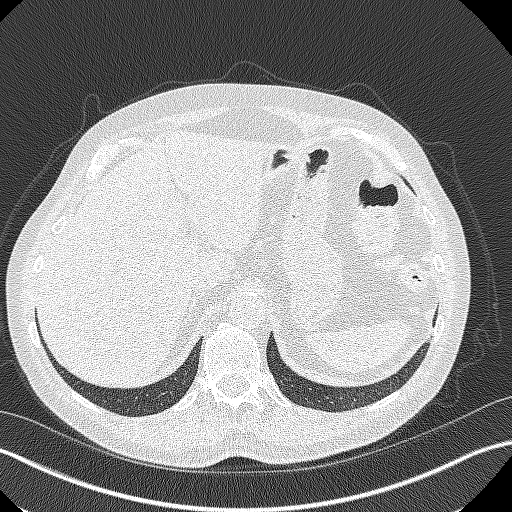
[im 107/337  lung]
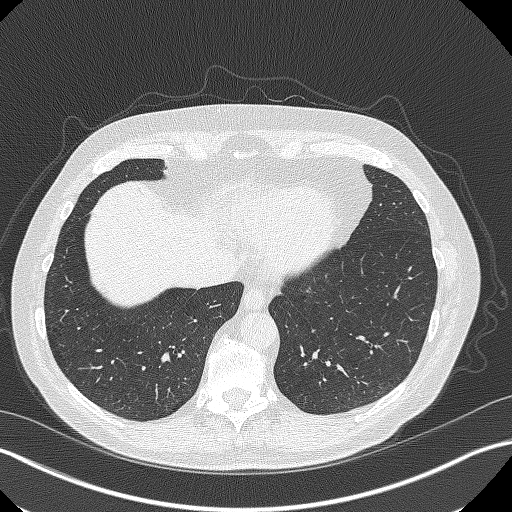
[im 153/337  lung]
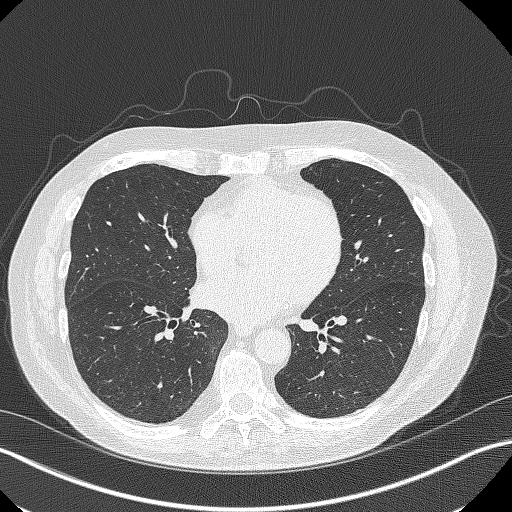
[im 184/337  lung]
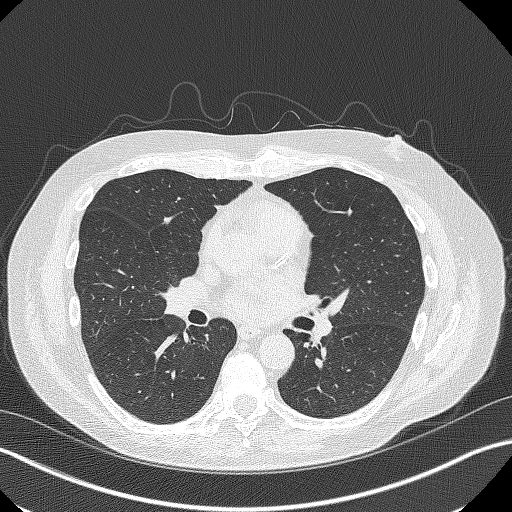
[im 230/337  lung]
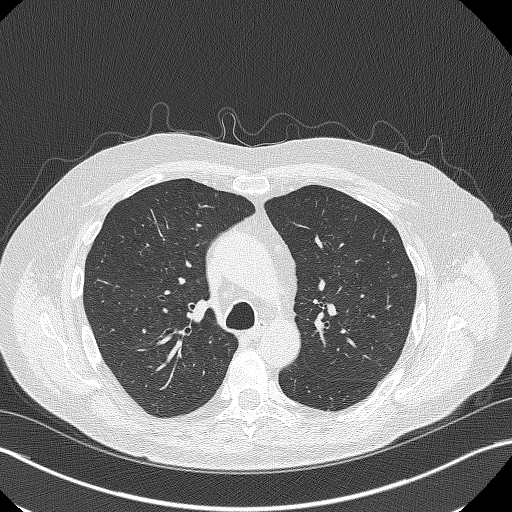
[im 275/337  lung]
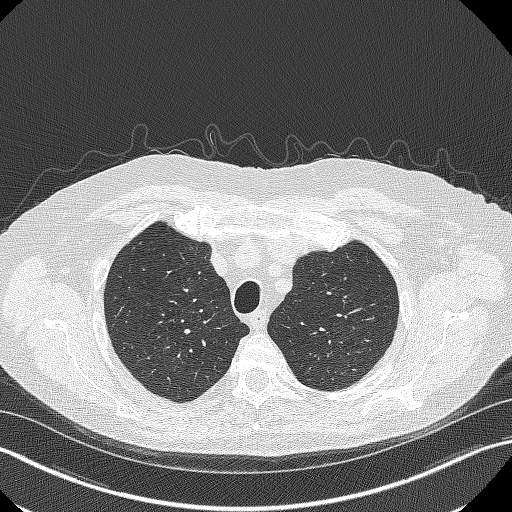
[im 306/337  lung]
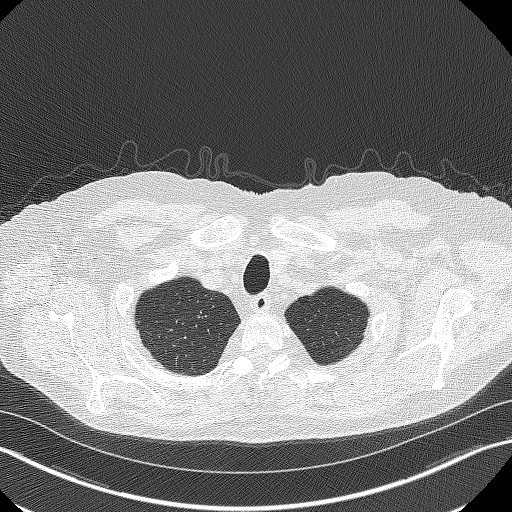

[13 of 40 positions shown; findings below may reference images not displayed]

FINDINGS: Cardiovascular: Aortic atherosclerosis. Normal heart size, without
pericardial effusion. Three vessel coronary artery calcification.

Mediastinum/Nodes: No mediastinal or definite hilar adenopathy,
given limitations of unenhanced CT.

Lungs/Pleura: No pleural fluid. Mild centrilobular emphysema.
Pulmonary nodules of maximally volume derived equivalent diameter
3.4 mm.

Upper Abdomen: Mild hepatic steatosis. Normal imaged portions of the
spleen, stomach, pancreas, gallbladder, right adrenal gland,
kidneys. 1.3 cm left adrenal nodule has indeterminate density
measurements. This was present and similar on comparison abdominal
CT, and can be presumed a benign adenoma.

Musculoskeletal: Mild convex right thoracic spine curvature.
IMPRESSION: 1. Lung-RADS 2, benign appearance or behavior. Continue annual
screening with low-dose chest CT without contrast in 12 months.
2. Hepatic steatosis.
3. Left adrenal adenoma.
4. Aortic Atherosclerosis (XAPWO-NEZ.Z) and Emphysema (XAPWO-IML.0).
Coronary artery atherosclerosis.

## 2023-12-18 SURGERY — CYSTOURETHROSCOPY, WITH URETHRAL STRICTURE DILATION USING DRUG-COATED BALLOON
Anesthesia: General

## 2023-12-18 MED ORDER — CEFAZOLIN SODIUM-DEXTROSE 2-4 GM/100ML-% IV SOLN
2.0000 g | INTRAVENOUS | Status: AC
Start: 2023-12-18 — End: 2023-12-18
  Administered 2023-12-18: 2 g via INTRAVENOUS
  Filled 2023-12-18: qty 100

## 2023-12-18 MED ORDER — PROPOFOL 10 MG/ML IV BOLUS
INTRAVENOUS | Status: AC
  Filled 2023-12-18: qty 20

## 2023-12-18 MED ORDER — GEMCITABINE CHEMO FOR BLADDER INSTILLATION 2000 MG
2000.0000 mg | Freq: Once | INTRAVENOUS | Status: DC
  Filled 2023-12-18: qty 52.6

## 2023-12-18 MED ORDER — EPHEDRINE 5 MG/ML INJ
INTRAVENOUS | Status: AC
Start: 2023-12-18 — End: 2023-12-18
  Filled 2023-12-18: qty 10

## 2023-12-18 MED ORDER — ONDANSETRON HCL 4 MG/2ML IJ SOLN
INTRAMUSCULAR | Status: AC
Start: 2023-12-18 — End: 2023-12-18
  Filled 2023-12-18: qty 2

## 2023-12-18 MED ORDER — ACETAMINOPHEN 500 MG PO TABS
1000.0000 mg | ORAL_TABLET | Freq: Once | ORAL | Status: AC
  Administered 2023-12-18: 1000 mg via ORAL
  Filled 2023-12-18: qty 2

## 2023-12-18 MED ORDER — ATROPINE SULFATE 0.4 MG/ML IV SOLN
INTRAVENOUS | Status: AC
  Filled 2023-12-18: qty 1

## 2023-12-18 MED ORDER — ORAL CARE MOUTH RINSE: 15.0000 mL | Freq: Once | OROMUCOSAL | Status: AC

## 2023-12-18 MED ORDER — DROPERIDOL 2.5 MG/ML IJ SOLN: 0.6250 mg | Freq: Once | INTRAMUSCULAR | Status: DC | PRN

## 2023-12-18 MED ORDER — DEXAMETHASONE SODIUM PHOSPHATE 10 MG/ML IJ SOLN
INTRAMUSCULAR | Status: DC | PRN
  Administered 2023-12-18: 6 mg via INTRAVENOUS

## 2023-12-18 MED ORDER — PHENYLEPHRINE 80 MCG/ML (10ML) SYRINGE FOR IV PUSH (FOR BLOOD PRESSURE SUPPORT)
PREFILLED_SYRINGE | INTRAVENOUS | Status: AC
  Filled 2023-12-18: qty 20

## 2023-12-18 MED ORDER — CHLORHEXIDINE GLUCONATE 0.12 % MT SOLN
15.0000 mL | Freq: Once | OROMUCOSAL | Status: AC
  Administered 2023-12-18: 15 mL via OROMUCOSAL

## 2023-12-18 MED ORDER — SUGAMMADEX SODIUM 200 MG/2ML IV SOLN
INTRAVENOUS | Status: DC | PRN
  Administered 2023-12-18: 200 mg via INTRAVENOUS

## 2023-12-18 MED ORDER — FENTANYL CITRATE (PF) 100 MCG/2ML IJ SOLN
INTRAMUSCULAR | Status: AC
  Filled 2023-12-18: qty 2

## 2023-12-18 MED ORDER — DEXAMETHASONE SODIUM PHOSPHATE 10 MG/ML IJ SOLN
INTRAMUSCULAR | Status: AC
  Filled 2023-12-18: qty 1

## 2023-12-18 MED ORDER — OXYCODONE HCL 5 MG PO TABS
5.0000 mg | ORAL_TABLET | Freq: Once | ORAL | Status: AC | PRN
  Administered 2023-12-18: 5 mg via ORAL

## 2023-12-18 MED ORDER — OXYCODONE HCL 5 MG PO TABS
ORAL_TABLET | ORAL | Status: AC
  Filled 2023-12-18: qty 1

## 2023-12-18 MED ORDER — OXYCODONE HCL 5 MG/5ML PO SOLN: 5.0000 mg | Freq: Once | ORAL | Status: AC | PRN

## 2023-12-18 MED ORDER — SODIUM CHLORIDE 0.9 % IR SOLN
Status: DC | PRN
  Administered 2023-12-18: 3000 mL via INTRAVESICAL

## 2023-12-18 MED ORDER — FENTANYL CITRATE PF 50 MCG/ML IJ SOSY
25.0000 ug | PREFILLED_SYRINGE | INTRAMUSCULAR | Status: DC | PRN
  Administered 2023-12-18: 50 ug via INTRAVENOUS

## 2023-12-18 MED ORDER — SUGAMMADEX SODIUM 200 MG/2ML IV SOLN
INTRAVENOUS | Status: AC
  Filled 2023-12-18: qty 2

## 2023-12-18 MED ORDER — FENTANYL CITRATE (PF) 100 MCG/2ML IJ SOLN
INTRAMUSCULAR | Status: DC | PRN
  Administered 2023-12-18: 100 ug via INTRAVENOUS

## 2023-12-18 MED ORDER — PROPOFOL 10 MG/ML IV BOLUS
INTRAVENOUS | Status: DC | PRN
  Administered 2023-12-18: 120 mg via INTRAVENOUS

## 2023-12-18 MED ORDER — LACTATED RINGERS IV SOLN: INTRAVENOUS | Status: DC

## 2023-12-18 MED ORDER — ONDANSETRON HCL 4 MG/2ML IJ SOLN
INTRAMUSCULAR | Status: DC | PRN
  Administered 2023-12-18: 4 mg via INTRAVENOUS

## 2023-12-18 MED ORDER — PHENYLEPHRINE 80 MCG/ML (10ML) SYRINGE FOR IV PUSH (FOR BLOOD PRESSURE SUPPORT)
PREFILLED_SYRINGE | INTRAVENOUS | Status: DC | PRN
  Administered 2023-12-18: 80 ug via INTRAVENOUS

## 2023-12-18 MED ORDER — SUGAMMADEX SODIUM 200 MG/2ML IV SOLN
INTRAVENOUS | Status: AC
Start: 2023-12-18 — End: 2023-12-18
  Filled 2023-12-18: qty 2

## 2023-12-18 MED ORDER — ROCURONIUM BROMIDE 10 MG/ML (PF) SYRINGE
PREFILLED_SYRINGE | INTRAVENOUS | Status: DC | PRN
  Administered 2023-12-18: 50 mg via INTRAVENOUS

## 2023-12-18 MED ORDER — ROCURONIUM BROMIDE 10 MG/ML (PF) SYRINGE
PREFILLED_SYRINGE | INTRAVENOUS | Status: AC
Start: 2023-12-18 — End: 2023-12-18
  Filled 2023-12-18: qty 10

## 2023-12-18 MED ORDER — IOHEXOL 300 MG/ML  SOLN
INTRAMUSCULAR | Status: DC | PRN
  Administered 2023-12-18: 40 mL

## 2023-12-18 MED ORDER — FENTANYL CITRATE PF 50 MCG/ML IJ SOSY
PREFILLED_SYRINGE | INTRAMUSCULAR | Status: AC
  Filled 2023-12-18: qty 1

## 2023-12-18 MED ORDER — LIDOCAINE 2% (20 MG/ML) 5 ML SYRINGE
INTRAMUSCULAR | Status: DC | PRN
  Administered 2023-12-18: 80 mg via INTRAVENOUS

## 2023-12-18 MED ORDER — POLYETHYLENE GLYCOL 3350 17 G PO PACK: 17.0000 g | PACK | Freq: Every day | ORAL | 0 refills | Status: AC

## 2023-12-18 SURGICAL SUPPLY — 34 items
BAG COUNTER SPONGE SURGICOUNT (BAG) IMPLANT
BAG URINE DRAIN 2000ML AR STRL (UROLOGICAL SUPPLIES) ×1 IMPLANT
BAG URO CATCHER STRL LF (MISCELLANEOUS) ×1 IMPLANT
BALLOON NEPHROSTOMY (BALLOONS) IMPLANT
BALLOON OPTILUME DCB 30X5X75 (BALLOONS) ×1 IMPLANT
CATH FOLEY 2W COUNCIL 20FR 5CC (CATHETERS) IMPLANT
CATH FOLEY 2W COUNCIL 5CC 18FR (CATHETERS) IMPLANT
CATH ROBINSON RED A/P 14FR (CATHETERS) IMPLANT
CATH URET 5FR 70CM CONE TIP (BALLOONS) IMPLANT
CATH URETL OPEN 5X70 (CATHETERS) IMPLANT
CATH X-FORCE N30 NEPHROSTOMY (TUBING) IMPLANT
CLOTH BEACON ORANGE TIMEOUT ST (SAFETY) ×1 IMPLANT
DEVICE INFLATION ATRION QL4015 (MISCELLANEOUS) IMPLANT
DRAPE FOOT SWITCH (DRAPES) ×1 IMPLANT
ELECT REM PT RETURN 15FT ADLT (MISCELLANEOUS) ×1 IMPLANT
GLOVE BIO SURGEON STRL SZ7.5 (GLOVE) ×1 IMPLANT
GLOVE BIO SURGEON STRL SZ8 (GLOVE) ×1 IMPLANT
GOWN STRL REUS W/ TWL XL LVL3 (GOWN DISPOSABLE) ×1 IMPLANT
GOWN STRL SURGICAL XL XLNG (GOWN DISPOSABLE) ×1 IMPLANT
GUIDEWIRE ANG ZIPWIRE 038X150 (WIRE) IMPLANT
GUIDEWIRE STR DUAL SENSOR (WIRE) ×1 IMPLANT
HOLDER FOLEY CATH W/STRAP (MISCELLANEOUS) IMPLANT
KIT TURNOVER KIT A (KITS) ×1 IMPLANT
LOOP CUT BIPOLAR 24F LRG (ELECTROSURGICAL) IMPLANT
MANIFOLD NEPTUNE II (INSTRUMENTS) ×1 IMPLANT
NDL SAFETY ECLIPSE 18X1.5 (NEEDLE) ×1 IMPLANT
NS IRRIG 1000ML POUR BTL (IV SOLUTION) IMPLANT
PACK CYSTO (CUSTOM PROCEDURE TRAY) ×1 IMPLANT
SYR 30ML LL (SYRINGE) IMPLANT
SYRINGE TOOMEY IRRIG 70ML (MISCELLANEOUS) IMPLANT
TRACTIP FLEXIVA PULS ID 200XHI (Laser) IMPLANT
TUBING CONNECTING 10 (TUBING) ×1 IMPLANT
TUBING UROLOGY SET (TUBING) ×1 IMPLANT
WATER STERILE IRR 3000ML UROMA (IV SOLUTION) ×1 IMPLANT

## 2023-12-18 NOTE — Transfer of Care (Signed)
 Immediate Anesthesia Transfer of Care Note  Patient: Thomas Grant  Procedure(s) Performed: Procedure(s): CYSTOURETHROSCOPY, WITH URETHRAL STRICTURE DILATION USING DRUG-COATED BALLOON (N/A) CYSTOSCOPY, WITH RETROGRADE PYELOGRAM (N/A)  Patient Location: PACU  Anesthesia Type:General  Level of Consciousness:  sedated, patient cooperative and responds to stimulation  Airway & Oxygen Therapy:Patient Spontanous Breathing and Patient connected to face mask oxgen  Post-op Assessment:  Report given to PACU RN and Post -op Vital signs reviewed and stable  Post vital signs:  Reviewed and stable  Last Vitals:  Vitals:   12/18/23 1141  BP: (!) 159/73  Pulse: 72  Resp: 16  Temp: 36.6 C  SpO2: 97%    Complications: No apparent anesthesia complications

## 2023-12-18 NOTE — Anesthesia Postprocedure Evaluation (Signed)
 Anesthesia Post Note  Patient: Thomas Grant  Procedure(s) Performed: CYSTOURETHROSCOPY, WITH URETHRAL STRICTURE DILATION USING DRUG-COATED BALLOON CYSTOSCOPY, WITH RETROGRADE PYELOGRAM     Patient location during evaluation: PACU Anesthesia Type: General Level of consciousness: awake and alert Pain management: pain level controlled Vital Signs Assessment: post-procedure vital signs reviewed and stable Respiratory status: spontaneous breathing, nonlabored ventilation, respiratory function stable and patient connected to nasal cannula oxygen Cardiovascular status: blood pressure returned to baseline and stable Postop Assessment: no apparent nausea or vomiting Anesthetic complications: no   No notable events documented.  Last Vitals:  Vitals:   12/18/23 1630 12/18/23 1645  BP: (!) 160/74 (!) 178/80  Pulse: 61 65  Resp: 12 14  Temp: 36.6 C   SpO2: 100% 98%    Last Pain:  Vitals:   12/18/23 1645  TempSrc:   PainSc: 4                  Rome Ade

## 2023-12-18 NOTE — Anesthesia Procedure Notes (Signed)
 Procedure Name: Intubation Date/Time: 12/18/2023 3:10 PM  Performed by: Vincenzo Show, CRNAPre-anesthesia Checklist: Patient identified, Emergency Drugs available, Suction available, Patient being monitored and Timeout performed Patient Re-evaluated:Patient Re-evaluated prior to induction Oxygen Delivery Method: Circle system utilized Preoxygenation: Pre-oxygenation with 100% oxygen Induction Type: IV induction Ventilation: Mask ventilation without difficulty and Oral airway inserted - appropriate to patient size Laryngoscope Size: Mac and 4 Grade View: Grade II Tube type: Oral Tube size: 7.5 mm Number of attempts: 1 Airway Equipment and Method: Stylet Placement Confirmation: ETT inserted through vocal cords under direct vision, positive ETCO2, CO2 detector and breath sounds checked- equal and bilateral Secured at: 23 cm Tube secured with: Tape Dental Injury: Teeth and Oropharynx as per pre-operative assessment  Comments: ATOI

## 2023-12-18 NOTE — Discharge Instructions (Signed)
 DC instructions for urethral dilation   Definition:  Urethral dilation with optilume is procedure used to open urethral narrowings.   General instructions:     Your recent bladder surgery requires very little post hospital care but some definite precautions.  Despite the fact that no skin incisions were used, the area around the bladder/urethral incisions are raw and covered with scabs to promote healing and prevent bleeding. Certain precautions are needed to insure that the scabs are not disturbed over the next 2-4 weeks while the healing proceeds.  Because the raw surface inside your bladder and the irritating effects of urine you may expect frequency of urination and/or urgency (a stronger desire to urinate) and perhaps even getting up at night more often. This will usually resolve or improve slowly over the healing period. You may see some blood in your urine over the first 6 weeks. Do not be alarmed, even if the urine was clear for a while. Get off your feet and drink lots of fluids until clearing occurs. If you start to pass clots or don't improve call us .  Catheter: Due to the nature of this surgery some patients need to be discharged with a catheter. If you are discharged with a catheter instructions on how to care for the catheter will be attached to your discharge paperwork. You will be called to schedule an appointment to remove the catheter.   Diet:  You may return to your normal diet immediately. Because of the raw surface of your bladder, alcohol, spicy foods, foods high in acid and drinks with caffeine may cause irritation or frequency and should be used in moderation. To keep your urine flowing freely and avoid constipation, drink plenty of fluids during the day (8-10 glasses). Tip: Avoid cranberry juice because it is very acidic.  Activity:  Do not lift more than 10 LBS for 2 weeks Do not strain with bowel movents.  Your physical activity doesn't need to be restricted.  However, if you are very active, you may see some blood in the urine. We suggest that you reduce your activity under the circumstances until the bleeding has stopped.  Bowels:  It is important to keep your bowels regular during the postoperative period. Straining with bowel movements can cause bleeding. A bowel movement every other day is reasonable. Use a mild laxative if needed, such as milk of magnesia 2-3 tablespoons, or 2 Dulcolax tablets. Call if you continue to have problems. If you had been taking narcotics for pain, before, during or after your surgery, you may be constipated. Take a laxative if necessary.    Medication:  You should resume your pre-surgery medications unless told not to. In addition you may be given an antibiotic to prevent or treat infection. Antibiotics are not always necessary. All medication should be taken as prescribed until the bottles are finished unless you are having an unusual reaction to one of the drugs.

## 2023-12-18 NOTE — Op Note (Signed)
 Dictation urethral dilation with Optilume  Preoperative diagnosis: Gross hematuria bladder neck contracture  Postoperative diagnosis: No tumor noted prostatic urethral stricture.  Procedure performed: Urethral dilation with Optilume chemotherapy coated balloon cystoscopy Cystoscopy  Post operative findings: Small strictures noted at the prostatic urethra from previous TURP.  No evidence of tumor within the bladder on multiple cystoscopies. Prostatic urethra dilated to 30 [place].  Indication for procedure: Mr. Thomas Grant is a 73 year old gentleman who presented to clinic with gross hematuria unable to evaluate the bladder due to bladder neck contracture.  Because of this decision was taken to the OR for dilation and evaluation of bladder with possible TURBT.  We discussed risk benefits alternatives of the procedure prior to the procedure and consent was obtained prior.  Anesthesia: General Antibiotics: Ancef  Fluids: Per anesthesia UOP: Not applicable EBL: Minimal Drains: 18 [place] council catheter Implants: None Complications: None  Procedure in detail: After informed consent was confirmed the preoperative patient taken the operative suite placed under anesthesia by the anesthesia team he is then placed in the dorsal lithotomy position and given his preoperative antibiotics.  Timeout was performed given demonstrating correct patient and procedure.  Next a 22 French cystoscope was advanced into the urethra into the bladder neck upon encountering the prostatic urethra there was several strictures at the bladder neck there was a bladder neck contracture.  A sensor wire was placed through this prior placement was confirmed with fluoroscopy.  The scope was removed and then the nephrostomy dilation balloon was placed over the wire and then into the bladder with only the distal aspect of the balloon into the prostatic urethra.  The balloon was then inflated to 8 ATM this showed good separation of the  stricture and the balloon was deflated and removed.  Next the 22 French cystoscope was advanced into the bladder and pan cystoscopy was performed several times no bladder tumors were identified.  There were several small diverticuli both ureteral orifices were in proper position with no concern for malignancy or abnormalities.  After this was done the scope was removed then under fluoroscopic guidance the Optilume balloon was placed into the urethra at the level of the stricture.  Optilume balloon was inflated for 2 minutes to 10 ATM.  The scope was then removed leaving the wire in place and an 65 [place] council tip catheter was placed over the wire and into the bladder.  Bladder was irrigated several times and the urine was clear.  Patient was then extubated in the operative room and taken the PACU in stable condition.  Disposition: Patient will be discharged home with plan to remove catheter in 1 week.

## 2023-12-19 ENCOUNTER — Encounter (HOSPITAL_COMMUNITY): Payer: Self-pay | Admitting: Urology

## 2023-12-22 ENCOUNTER — Other Ambulatory Visit: Payer: Self-pay

## 2023-12-22 DIAGNOSIS — I2510 Atherosclerotic heart disease of native coronary artery without angina pectoris: Secondary | ICD-10-CM

## 2023-12-23 MED ORDER — CARVEDILOL 6.25 MG PO TABS: 6.2500 mg | ORAL_TABLET | Freq: Two times a day (BID) | ORAL | 0 refills | Status: DC

## 2023-12-24 DIAGNOSIS — N320 Bladder-neck obstruction: Secondary | ICD-10-CM | POA: Diagnosis not present

## 2023-12-24 DIAGNOSIS — N401 Enlarged prostate with lower urinary tract symptoms: Secondary | ICD-10-CM | POA: Diagnosis not present

## 2023-12-24 DIAGNOSIS — R3912 Poor urinary stream: Secondary | ICD-10-CM | POA: Diagnosis not present

## 2023-12-24 DIAGNOSIS — R310 Gross hematuria: Secondary | ICD-10-CM | POA: Diagnosis not present

## 2024-01-09 DIAGNOSIS — R399 Unspecified symptoms and signs involving the genitourinary system: Secondary | ICD-10-CM | POA: Diagnosis not present

## 2024-01-09 DIAGNOSIS — R3912 Poor urinary stream: Secondary | ICD-10-CM | POA: Diagnosis not present

## 2024-01-09 DIAGNOSIS — N320 Bladder-neck obstruction: Secondary | ICD-10-CM | POA: Diagnosis not present

## 2024-01-09 DIAGNOSIS — N401 Enlarged prostate with lower urinary tract symptoms: Secondary | ICD-10-CM | POA: Diagnosis not present

## 2024-01-09 DIAGNOSIS — R310 Gross hematuria: Secondary | ICD-10-CM | POA: Diagnosis not present

## 2024-01-13 ENCOUNTER — Other Ambulatory Visit: Payer: Self-pay | Admitting: Cardiology

## 2024-01-13 DIAGNOSIS — E782 Mixed hyperlipidemia: Secondary | ICD-10-CM

## 2024-01-14 ENCOUNTER — Telehealth: Payer: Self-pay | Admitting: Cardiology

## 2024-01-14 DIAGNOSIS — E782 Mixed hyperlipidemia: Secondary | ICD-10-CM

## 2024-01-14 MED ORDER — EZETIMIBE 10 MG PO TABS: 10.0000 mg | ORAL_TABLET | Freq: Every day | ORAL | 0 refills | Status: DC

## 2024-01-14 MED ORDER — ROSUVASTATIN CALCIUM 20 MG PO TABS: 20.0000 mg | ORAL_TABLET | Freq: Every day | ORAL | 2 refills | Status: DC

## 2024-01-14 NOTE — Telephone Encounter (Signed)
*  STAT* If patient is at the pharmacy, call can be transferred to refill team.   1. Which medications need to be refilled? (please list name of each medication and dose if known)   rosuvastatin  (CRESTOR ) 20 MG tablet    2. Which pharmacy/location (including street and city if local pharmacy) is medication to be sent to? [facility] [id number] [place], [place] - [address] Phone: [phone]  Fax: [phone]      3. Do they need a 30 day or 90 day supply? Pt made appt please refill until then. Pt is out of medication

## 2024-01-14 NOTE — Telephone Encounter (Signed)
 Pt's medication was sent to pt's pharmacy as requested. Confirmation received.

## 2024-02-02 ENCOUNTER — Other Ambulatory Visit: Payer: Self-pay | Admitting: Cardiology

## 2024-02-02 DIAGNOSIS — E782 Mixed hyperlipidemia: Secondary | ICD-10-CM

## 2024-02-02 DIAGNOSIS — I10 Essential (primary) hypertension: Secondary | ICD-10-CM

## 2024-02-03 DIAGNOSIS — R1310 Dysphagia, unspecified: Secondary | ICD-10-CM | POA: Diagnosis not present

## 2024-02-04 MED ORDER — LISINOPRIL-HYDROCHLOROTHIAZIDE 20-25 MG PO TABS: 1.0000 | ORAL_TABLET | Freq: Every day | ORAL | 0 refills | Status: DC

## 2024-02-04 MED ORDER — EZETIMIBE 10 MG PO TABS: 10.0000 mg | ORAL_TABLET | Freq: Every day | ORAL | 0 refills | Status: DC

## 2024-02-11 DIAGNOSIS — K317 Polyp of stomach and duodenum: Secondary | ICD-10-CM | POA: Diagnosis not present

## 2024-02-11 DIAGNOSIS — K2970 Gastritis, unspecified, without bleeding: Secondary | ICD-10-CM | POA: Diagnosis not present

## 2024-02-11 DIAGNOSIS — R1310 Dysphagia, unspecified: Secondary | ICD-10-CM | POA: Diagnosis not present

## 2024-02-11 DIAGNOSIS — K222 Esophageal obstruction: Secondary | ICD-10-CM | POA: Diagnosis not present

## 2024-02-11 DIAGNOSIS — K209 Esophagitis, unspecified without bleeding: Secondary | ICD-10-CM | POA: Diagnosis not present

## 2024-02-11 DIAGNOSIS — K3189 Other diseases of stomach and duodenum: Secondary | ICD-10-CM | POA: Diagnosis not present

## 2024-02-13 DIAGNOSIS — K3189 Other diseases of stomach and duodenum: Secondary | ICD-10-CM | POA: Diagnosis not present

## 2024-02-13 DIAGNOSIS — K317 Polyp of stomach and duodenum: Secondary | ICD-10-CM | POA: Diagnosis not present

## 2024-02-13 DIAGNOSIS — K209 Esophagitis, unspecified without bleeding: Secondary | ICD-10-CM | POA: Diagnosis not present

## 2024-03-03 ENCOUNTER — Other Ambulatory Visit: Payer: Self-pay

## 2024-03-03 DIAGNOSIS — I2510 Atherosclerotic heart disease of native coronary artery without angina pectoris: Secondary | ICD-10-CM

## 2024-03-04 MED ORDER — CARVEDILOL 6.25 MG PO TABS: 6.2500 mg | ORAL_TABLET | Freq: Two times a day (BID) | ORAL | 0 refills | Status: AC

## 2024-04-08 ENCOUNTER — Ambulatory Visit: Admitting: Cardiology

## 2024-04-12 ENCOUNTER — Other Ambulatory Visit: Payer: Self-pay | Admitting: Cardiology

## 2024-04-12 DIAGNOSIS — E782 Mixed hyperlipidemia: Secondary | ICD-10-CM

## 2024-04-13 MED ORDER — ROSUVASTATIN CALCIUM 20 MG PO TABS: 20.0000 mg | ORAL_TABLET | Freq: Every day | ORAL | 1 refills | Status: AC

## 2024-04-13 MED ORDER — EZETIMIBE 10 MG PO TABS: 10.0000 mg | ORAL_TABLET | Freq: Every day | ORAL | 1 refills | Status: AC

## 2024-04-13 MED ORDER — EZETIMIBE 10 MG PO TABS: 10.0000 mg | ORAL_TABLET | Freq: Every day | ORAL | 0 refills | Status: DC

## 2024-05-03 ENCOUNTER — Other Ambulatory Visit: Payer: Self-pay

## 2024-05-03 DIAGNOSIS — I10 Essential (primary) hypertension: Secondary | ICD-10-CM

## 2024-05-06 MED ORDER — LISINOPRIL-HYDROCHLOROTHIAZIDE 20-25 MG PO TABS: 1.0000 | ORAL_TABLET | Freq: Every day | ORAL | 0 refills | Status: AC

## 2024-05-06 NOTE — Telephone Encounter (Signed)
 Labs can be Completed at next O/V

## 2024-06-08 ENCOUNTER — Ambulatory Visit: Admitting: Cardiology
